# Patient Record
Sex: Male | Born: 1965 | Race: White | Hispanic: No | Marital: Married | Smoking: Never smoker
Health system: Southern US, Community
[De-identification: ages and names within clinical notes are randomized; demographics above are authoritative.]

## PROBLEM LIST (undated history)

## (undated) DIAGNOSIS — I10 Essential (primary) hypertension: Secondary | ICD-10-CM

## (undated) HISTORY — PX: HERNIA REPAIR: SHX51

---

## 2020-09-24 ENCOUNTER — Ambulatory Visit
Admission: EM | Admit: 2020-09-24 | Discharge: 2020-09-24 | Disposition: A | Discharge status: Home / Self Care | Payer: BC Managed Care – PPO | Attending: Family Medicine | Admitting: Family Medicine

## 2020-09-24 ENCOUNTER — Ambulatory Visit (INDEPENDENT_AMBULATORY_CARE_PROVIDER_SITE_OTHER): Payer: BC Managed Care – PPO

## 2020-09-24 DIAGNOSIS — R053 Chronic cough: Secondary | ICD-10-CM

## 2020-09-24 DIAGNOSIS — R062 Wheezing: Secondary | ICD-10-CM

## 2020-09-24 DIAGNOSIS — R059 Cough, unspecified: Secondary | ICD-10-CM | POA: Diagnosis not present

## 2020-09-24 DIAGNOSIS — J069 Acute upper respiratory infection, unspecified: Secondary | ICD-10-CM

## 2020-09-24 HISTORY — DX: Essential (primary) hypertension: I10

## 2020-09-24 MED ORDER — PREDNISONE 20 MG PO TABS: 40.0000 mg | ORAL_TABLET | Freq: Every day | ORAL | 0 refills | Status: DC

## 2020-09-24 MED ORDER — HYDROCOD POLST-CPM POLST ER 10-8 MG/5ML PO SUER: 5.0000 mL | Freq: Every evening | ORAL | 0 refills | Status: DC | PRN

## 2020-09-24 NOTE — Discharge Instructions (Signed)
Be aware, your cough medication may cause drowsiness. Please do not drive, operate heavy machinery or make important decisions while on this medication, it can cloud your judgement.  

## 2020-09-24 NOTE — ED Provider Notes (Signed)
[facility]   [id number] 09/24/20 Arrival Time: 9371  ASSESSMENT & PLAN:  1. Persistent cough for 3 weeks or longer   2. Viral URI with cough   3. Wheezing    I have personally viewed the imaging studies ordered this visit. CXR: No acute changes.  Discussed typical duration of viral illnesses. Declines COVID testing.  Begin trial of: Meds ordered this encounter  Medications   predniSONE (DELTASONE) 20 MG tablet    Sig: Take 2 tablets (40 mg total) by mouth daily.    Dispense:  10 tablet    Refill:  0   chlorpheniramine-HYDROcodone (TUSSIONEX PENNKINETIC ER) 10-8 MG/5ML SUER    Sig: Take 5 mLs by mouth at bedtime as needed for cough.    Dispense:  60 mL    Refill:  0     Discharge Instructions      Be aware, your cough medication may cause drowsiness. Please do not drive, operate heavy machinery or make important decisions while on this medication, it can cloud your judgement.       Follow-up Information     [facility].   Specialty: Urgent Care Why: If worsening or failing to improve as anticipated. Contact information: [address], [address] [place] [address] [phone]                Reviewed expectations re: course of current medical issues. Questions answered. Outlined signs and symptoms indicating need for more acute intervention. Understanding verbalized. After Visit Summary given.   SUBJECTIVE: History from: patient. Kyle Werner is a 55 y.o. male who reports cough and ST; past week. But also with sporadic cough over past month. No SOB or CP. Denies: fever and headache. Normal PO intake without n/v/d. Questions h/o asthma and wheezing at times. Cough does affect sleep.  OBJECTIVE:  Vitals:   09/24/20 1017  BP: (!) 144/89  Pulse: (!) 115  Resp: 20  Temp: 98.5 F (36.9 C)  SpO2: 94%    Tachycardia noted. No symptoms. General appearance: alert; no distress Eyes: PERRLA;  EOMI; conjunctiva normal HENT: [place]; AT; with mild nasal congestion Neck: supple  Lungs: speaks full sentences without difficulty; unlabored; bilateral exp wheezing present Extremities: no edema Skin: warm and dry Neurologic: normal gait Psychological: alert and cooperative; normal mood and affect  Imaging: DG Chest 2 View  Result Date: 09/24/2020 CLINICAL DATA:  Cough for greater than 3 weeks. EXAM: CHEST - 2 VIEW COMPARISON:  None. FINDINGS: Two views of the chest demonstrate slightly coarse lung markings without focal airspace disease or overt pulmonary edema. No pleural effusions. Heart and mediastinum are within normal limits. Trachea is midline. Negative for a pneumothorax. No acute bone abnormality. IMPRESSION: No acute cardiopulmonary disease. Electronically Signed   By: Richarda Overlie M.D.   On: 09/24/2020 10:49    Not on File  Past Medical History:  Diagnosis Date   Hypertension    Social History   Socioeconomic History   Marital status: Married    Spouse name: Not on file   Number of children: Not on file   Years of education: Not on file   Highest education level: Not on file  Occupational History   Not on file  Tobacco Use   Smoking status: Never   Smokeless tobacco: Never  Substance and Sexual Activity   Alcohol use: Not Currently   Drug use: Never   Sexual activity: Not on file  Other Topics Concern  Not on file  Social History Narrative   Not on file   Social Determinants of Health   Financial Resource Strain: Not on file  Food Insecurity: Not on file  Transportation Needs: Not on file  Physical Activity: Not on file  Stress: Not on file  Social Connections: Not on file  Intimate Partner Violence: Not on file   Family History  Problem Relation Age of Onset   Cancer Mother    Hypertension Father    Past Surgical History:  Procedure Laterality Date   HERNIA REPAIR       Mardella Layman, MD 09/24/20 1214

## 2020-09-24 NOTE — ED Triage Notes (Signed)
Pt presents with cough and sore throat for past week, states that he has had recurrent cough for a few months

## 2022-01-06 ENCOUNTER — Ambulatory Visit: Payer: BC Managed Care – PPO | Admitting: Nurse Practitioner

## 2022-01-07 ENCOUNTER — Encounter: Payer: Self-pay | Admitting: Nurse Practitioner

## 2022-01-07 ENCOUNTER — Ambulatory Visit: Payer: BC Managed Care – PPO | Admitting: Nurse Practitioner

## 2022-01-07 VITALS — BP 156/88 | HR 63 | Temp 98.7°F | Ht 69.0 in | Wt 230.0 lb

## 2022-01-07 DIAGNOSIS — E291 Testicular hypofunction: Secondary | ICD-10-CM | POA: Insufficient documentation

## 2022-01-07 DIAGNOSIS — Z0283 Encounter for blood-alcohol and blood-drug test: Secondary | ICD-10-CM | POA: Diagnosis not present

## 2022-01-07 DIAGNOSIS — I10 Essential (primary) hypertension: Secondary | ICD-10-CM | POA: Diagnosis not present

## 2022-01-07 MED ORDER — LOSARTAN POTASSIUM-HCTZ 100-12.5 MG PO TABS: 1.0000 | ORAL_TABLET | Freq: Every day | ORAL | 1 refills | Status: DC

## 2022-01-07 NOTE — Assessment & Plan Note (Signed)
Completed drug screening results pending.

## 2022-01-07 NOTE — Assessment & Plan Note (Signed)
Completed testosterone labs results pending.

## 2022-01-07 NOTE — Patient Instructions (Signed)
Hypertension, Adult ?Hypertension is another name for high blood pressure. High blood pressure forces your heart to work harder to pump blood. This can cause problems over time. ?There are two numbers in a blood pressure reading. There is a top number (systolic) over a bottom number (diastolic). It is best to have a blood pressure that is below 120/80. ?What are the causes? ?The cause of this condition is not known. Some other conditions can lead to high blood pressure. ?What increases the risk? ?Some lifestyle factors can make you more likely to develop high blood pressure: ?Smoking. ?Not getting enough exercise or physical activity. ?Being overweight. ?Having too much fat, sugar, calories, or salt (sodium) in your diet. ?Drinking too much alcohol. ?Other risk factors include: ?Having any of these conditions: ?Heart disease. ?Diabetes. ?High cholesterol. ?Kidney disease. ?Obstructive sleep apnea. ?Having a family history of high blood pressure and high cholesterol. ?Age. The risk increases with age. ?Stress. ?What are the signs or symptoms? ?High blood pressure may not cause symptoms. Very high blood pressure (hypertensive crisis) may cause: ?Headache. ?Fast or uneven heartbeats (palpitations). ?Shortness of breath. ?Nosebleed. ?Vomiting or feeling like you may vomit (nauseous). ?Changes in how you see. ?Very bad chest pain. ?Feeling dizzy. ?Seizures. ?How is this treated? ?This condition is treated by making healthy lifestyle changes, such as: ?Eating healthy foods. ?Exercising more. ?Drinking less alcohol. ?Your doctor may prescribe medicine if lifestyle changes do not help enough and if: ?Your top number is above 130. ?Your bottom number is above 80. ?Your personal target blood pressure may vary. ?Follow these instructions at home: ?Eating and drinking ? ?If told, follow the DASH eating plan. To follow this plan: ?Fill one half of your plate at each meal with fruits and vegetables. ?Fill one fourth of your plate  at each meal with whole grains. Whole grains include whole-wheat pasta, brown rice, and whole-grain bread. ?Eat or drink low-fat dairy products, such as skim milk or low-fat yogurt. ?Fill one fourth of your plate at each meal with low-fat (lean) proteins. Low-fat proteins include fish, chicken without skin, eggs, beans, and tofu. ?Avoid fatty meat, cured and processed meat, or chicken with skin. ?Avoid pre-made or processed food. ?Limit the amount of salt in your diet to less than 1,500 mg each day. ?Do not drink alcohol if: ?Your doctor tells you not to drink. ?You are pregnant, may be pregnant, or are planning to become pregnant. ?If you drink alcohol: ?Limit how much you have to: ?0-1 drink a day for women. ?0-2 drinks a day for men. ?Know how much alcohol is in your drink. In the [place] one drink equals one 12 oz bottle of beer (355 mL), one 5 oz glass of wine (148 mL), or one 1? oz glass of hard liquor (44 mL). ?Lifestyle ? ?Work with your doctor to stay at a healthy weight or to lose weight. Ask your doctor what the best weight is for you. ?Get at least 30 minutes of exercise that causes your heart to beat faster (aerobic exercise) most days of the week. This may include walking, swimming, or biking. ?Get at least 30 minutes of exercise that strengthens your muscles (resistance exercise) at least 3 days a week. This may include lifting weights or doing Pilates. ?Do not smoke or use any products that contain nicotine or tobacco. If you need help quitting, ask your doctor. ?Check your blood pressure at home as told by your doctor. ?Keep all follow-up visits. ?Medicines ?Take over-the-counter and prescription medicines   only as told by your doctor. Follow directions carefully. ?Do not skip doses of blood pressure medicine. The medicine does not work as well if you skip doses. Skipping doses also puts you at risk for problems. ?Ask your doctor about side effects or reactions to medicines that you should watch  for. ?Contact a doctor if: ?You think you are having a reaction to the medicine you are taking. ?You have headaches that keep coming back. ?You feel dizzy. ?You have swelling in your ankles. ?You have trouble with your vision. ?Get help right away if: ?You get a very bad headache. ?You start to feel mixed up (confused). ?You feel weak or numb. ?You feel faint. ?You have very bad pain in your: ?Chest. ?Belly (abdomen). ?You vomit more than once. ?You have trouble breathing. ?These symptoms may be an emergency. Get help right away. Call 911. ?Do not wait to see if the symptoms will go away. ?Do not drive yourself to the hospital. ?Summary ?Hypertension is another name for high blood pressure. ?High blood pressure forces your heart to work harder to pump blood. ?For most people, a normal blood pressure is less than 120/80. ?Making healthy choices can help lower blood pressure. If your blood pressure does not get lower with healthy choices, you may need to take medicine. ?This information is not intended to replace advice given to you by your health care provider. Make sure you discuss any questions you have with your health care provider. ?Document Revised: 12/05/2020 Document Reviewed: 12/05/2020 ?Elsevier Patient Education ? 2023 Elsevier Inc. ? ?

## 2022-01-07 NOTE — Assessment & Plan Note (Signed)
Completed assessment.  Education provided to patient on hypertension management.  Advised low-sodium heart healthy diet, increase hydration, exercise and weight loss recommended increase vegetables and fruits in diet.  Rx refill sent to pharmacy. Follow-up in 6 months.

## 2022-01-07 NOTE — Progress Notes (Signed)
New Patient Note  RE: Suryansh Craigen MRN: [id number] DOB: [birth year] Date of Office Visit: 01/07/2022  Chief Complaint: Establish Care and Hypertension  History of Present Illness: Patient is a 56 year old male who presents to clinic to establish care with a history of hypertension.  Hypertension: Patient here for follow-up of elevated blood pressure. He is not exercising and is not adherent to low salt diet.  Blood pressure is not well controlled at home, blood pressure medication has not been taking his medication as prescribed causing him to have elevated blood pressure in clinic today. Cardiac symptoms none. Patient denies none.  Cardiovascular risk factors: none. Use of agents associated with hypertension: none. History of target organ damage: none.   Assessment and Plan: Elijah is a 56 y.o. male with: Essential hypertension Completed assessment.  Education provided to patient on hypertension management.  Advised low-sodium heart healthy diet, increase hydration, exercise and weight loss recommended increase vegetables and fruits in diet.  Rx refill sent to pharmacy. Follow-up in 6 months.  Hypogonadism in male Completed testosterone labs results pending.  Encounter for drug screening Completed drug screening results pending.  Return in about 3 months (around 04/09/2022).   Diagnostics:   Past Medical History: Patient Active Problem List   Diagnosis Date Noted   Essential hypertension 01/07/2022   Hypogonadism in male 01/07/2022   Encounter for drug screening 01/07/2022   Past Medical History:  Diagnosis Date   Hypertension    Past Surgical History: Past Surgical History:  Procedure Laterality Date   HERNIA REPAIR     Medication List:  Current Outpatient Medications  Medication Sig Dispense Refill   albuterol (VENTOLIN HFA) 108 (90 Base) MCG/ACT inhaler Inhale into the lungs every 6 (six) hours as needed for wheezing or shortness of breath.     testosterone cypionate  (DEPOTESTOTERONE CYPIONATE) 100 MG/ML injection Inject 200 mg into the muscle every 14 (fourteen) days. For IM use only     losartan-hydrochlorothiazide (HYZAAR) 100-12.5 MG tablet Take 1 tablet by mouth daily. 90 tablet 1   No current facility-administered medications for this visit.   Allergies: No Known Allergies Social History: Social History   Socioeconomic History   Marital status: Married    Spouse name: Pamala   Number of children: 4   Years of education: Not on file   Highest education level: Not on file  Occupational History   Occupation: Development worker, [employer]  Tobacco Use   Smoking status: Never   Smokeless tobacco: Never  Vaping Use   Vaping Use: Never used  Substance and Sexual Activity   Alcohol use: Not Currently   Drug use: Never   Sexual activity: Yes  Other Topics Concern   Not on file  Social History Narrative   Not on file   Social Determinants of Health   Financial Resource Strain: Not on file  Food Insecurity: Not on file  Transportation Needs: Not on file  Physical Activity: Not on file  Stress: Not on file  Social Connections: Not on file       Family History: Family History  Problem Relation Age of Onset   Cancer Mother    Hypertension Father          Review of Systems  Constitutional: Negative.  Negative for activity change and appetite change.  HENT: Negative.    Eyes: Negative.   Respiratory: Negative.    Cardiovascular: Negative.   Genitourinary: Negative.   Musculoskeletal: Negative.   Skin: Negative.   All  other systems reviewed and are negative.  Objective: BP (!) 156/88   Pulse 63   Temp 98.7 F (37.1 C)   Ht 5\' 9"  (1.753 m)   Wt 230 lb (104.3 kg)   SpO2 95%   BMI 33.97 kg/m  Body mass index is 33.97 kg/m. Physical Exam Vitals and nursing note reviewed.  HENT:     Head: Normocephalic.     Right Ear: External ear normal.     Left Ear: External ear normal.     Nose: Nose normal.     Mouth/Throat:      Mouth: Mucous membranes are moist.     Pharynx: Oropharynx is clear.  Eyes:     Conjunctiva/sclera: Conjunctivae normal.  Cardiovascular:     Pulses: Normal pulses.     Heart sounds: Normal heart sounds.  Pulmonary:     Effort: Pulmonary effort is normal.     Breath sounds: Normal breath sounds.  Abdominal:     General: Bowel sounds are normal.  Skin:    General: Skin is warm.     Findings: No erythema or rash.  Neurological:     General: No focal deficit present.     Mental Status: He is alert and oriented to person, place, and time.  Psychiatric:        Mood and Affect: Mood normal.        Behavior: Behavior normal.    The plan was reviewed with the patient/family, and all questions/concerned were addressed.  It was my pleasure to see Kiah today and participate in his care. Please feel free to contact me with any questions or concerns.  Sincerely,  Fayrene Fearing NP [facility]

## 2022-01-08 ENCOUNTER — Other Ambulatory Visit: Payer: Self-pay | Admitting: Nurse Practitioner

## 2022-01-08 DIAGNOSIS — E291 Testicular hypofunction: Secondary | ICD-10-CM

## 2022-01-08 MED ORDER — TESTOSTERONE CYPIONATE 100 MG/ML IM SOLN: 200.0000 mg | INTRAMUSCULAR | 0 refills | Status: DC

## 2022-01-09 LAB — CMP14+EGFR
ALT: 55 IU/L — ABNORMAL HIGH (ref 0–44)
AST: 29 IU/L (ref 0–40)
Albumin/Globulin Ratio: 1.7 (ref 1.2–2.2)
Albumin: 4.5 g/dL (ref 3.8–4.9)
Alkaline Phosphatase: 110 IU/L (ref 44–121)
BUN/Creatinine Ratio: 15 (ref 9–20)
BUN: 16 mg/dL (ref 6–24)
Bilirubin Total: 0.5 mg/dL (ref 0.0–1.2)
CO2: 24 mmol/L (ref 20–29)
Calcium: 9.4 mg/dL (ref 8.7–10.2)
Chloride: 102 mmol/L (ref 96–106)
Creatinine, Ser: 1.04 mg/dL (ref 0.76–1.27)
Globulin, Total: 2.7 g/dL (ref 1.5–4.5)
Glucose: 81 mg/dL (ref 70–99)
Potassium: 4.3 mmol/L (ref 3.5–5.2)
Sodium: 140 mmol/L (ref 134–144)
Total Protein: 7.2 g/dL (ref 6.0–8.5)
eGFR: 84 mL/min/{1.73_m2} (ref >59)

## 2022-01-09 LAB — CBC WITH DIFFERENTIAL/PLATELET
Basophils Absolute: 0.1 10*3/uL (ref 0.0–0.2)
Basos: 1 %
EOS (ABSOLUTE): 0.2 10*3/uL (ref 0.0–0.4)
Eos: 2 %
Hematocrit: 48.6 % (ref 37.5–51.0)
Hemoglobin: 16.7 g/dL (ref 13.0–17.7)
Immature Grans (Abs): 0.1 10*3/uL (ref 0.0–0.1)
Immature Granulocytes: 1 %
Lymphocytes Absolute: 2.2 10*3/uL (ref 0.7–3.1)
Lymphs: 26 %
MCH: 31.2 pg (ref 26.6–33.0)
MCHC: 34.4 g/dL (ref 31.5–35.7)
MCV: 91 fL (ref 79–97)
Monocytes Absolute: 0.7 10*3/uL (ref 0.1–0.9)
Monocytes: 8 %
Neutrophils Absolute: 5.3 10*3/uL (ref 1.4–7.0)
Neutrophils: 62 %
Platelets: 323 10*3/uL (ref 150–450)
RBC: 5.36 x10E6/uL (ref 4.14–5.80)
RDW: 11.6 % (ref 11.6–15.4)
WBC: 8.6 10*3/uL (ref 3.4–10.8)

## 2022-01-09 LAB — LIPID PANEL
Chol/HDL Ratio: 5.2 ratio — ABNORMAL HIGH (ref 0.0–5.0)
Cholesterol, Total: 225 mg/dL — ABNORMAL HIGH (ref 100–199)
HDL: 43 mg/dL (ref >39)
LDL Chol Calc (NIH): 151 mg/dL — ABNORMAL HIGH (ref 0–99)
Triglycerides: 171 mg/dL — ABNORMAL HIGH (ref 0–149)
VLDL Cholesterol Cal: 31 mg/dL (ref 5–40)

## 2022-01-09 LAB — TESTOSTERONE,FREE AND TOTAL
Testosterone, Free: 4 pg/mL — ABNORMAL LOW (ref 7.2–24.0)
Testosterone: 124 ng/dL — ABNORMAL LOW (ref 264–916)

## 2022-01-12 LAB — TOXASSURE SELECT 13 (MW), URINE

## 2022-01-14 ENCOUNTER — Telehealth: Payer: Self-pay | Admitting: Nurse Practitioner

## 2022-01-14 ENCOUNTER — Other Ambulatory Visit: Payer: Self-pay

## 2022-01-14 DIAGNOSIS — E291 Testicular hypofunction: Secondary | ICD-10-CM

## 2022-01-14 MED ORDER — TESTOSTERONE CYPIONATE 100 MG/ML IM SOLN: 200.0000 mg | INTRAMUSCULAR | 0 refills | Status: DC

## 2022-01-14 NOTE — Telephone Encounter (Signed)
Yes its okay to resend

## 2022-01-20 ENCOUNTER — Other Ambulatory Visit: Payer: Self-pay | Admitting: Nurse Practitioner

## 2022-01-20 DIAGNOSIS — E291 Testicular hypofunction: Secondary | ICD-10-CM

## 2022-01-20 MED ORDER — TESTOSTERONE CYPIONATE 100 MG/ML IM SOLN: 200.0000 mg | INTRAMUSCULAR | 0 refills | Status: DC

## 2022-01-20 NOTE — Telephone Encounter (Signed)
Please resend refill, 01/14/22 refill was set to Print did not go electronically

## 2022-01-20 NOTE — Telephone Encounter (Signed)
  Prescription Request  01/20/2022  Is this a "Controlled Substance" medicine?   Have you seen your PCP in the last 2 weeks? Seen 11/8  If YES, route message to pool  -  If NO, patient needs to be scheduled for appointment.  What is the name of the medication or equipment?  testosterone cypionate (DEPOTESTOTERONE CYPIONATE) 100 MG/ML injection    Have you contacted your pharmacy to request a refill? Yes, they said they did not receive it    Which pharmacy would you like this sent to? [facility] in [place]   Patient notified that their request is being sent to the clinical staff for review and that they should receive a response within 2 business days.

## 2022-03-22 ENCOUNTER — Other Ambulatory Visit: Payer: Self-pay | Admitting: Nurse Practitioner

## 2022-03-22 DIAGNOSIS — E291 Testicular hypofunction: Secondary | ICD-10-CM

## 2022-03-23 NOTE — Telephone Encounter (Signed)
Je pt NTBS in Feb by new provider. RF for controlled med not sent to covering provider, since 3 mos FU not already scheduled.

## 2022-03-23 NOTE — Telephone Encounter (Signed)
Pt scheduled appt with TM 04/06/22

## 2022-04-06 ENCOUNTER — Encounter: Payer: Self-pay | Admitting: Family Medicine

## 2022-04-06 ENCOUNTER — Ambulatory Visit (INDEPENDENT_AMBULATORY_CARE_PROVIDER_SITE_OTHER): Payer: BC Managed Care – PPO | Admitting: Family Medicine

## 2022-04-06 VITALS — BP 139/82 | HR 81 | Temp 98.6°F | Ht 69.0 in | Wt 247.2 lb

## 2022-04-06 DIAGNOSIS — E782 Mixed hyperlipidemia: Secondary | ICD-10-CM | POA: Diagnosis not present

## 2022-04-06 DIAGNOSIS — I10 Essential (primary) hypertension: Secondary | ICD-10-CM | POA: Diagnosis not present

## 2022-04-06 DIAGNOSIS — E291 Testicular hypofunction: Secondary | ICD-10-CM | POA: Diagnosis not present

## 2022-04-06 DIAGNOSIS — L989 Disorder of the skin and subcutaneous tissue, unspecified: Secondary | ICD-10-CM

## 2022-04-06 DIAGNOSIS — R110 Nausea: Secondary | ICD-10-CM

## 2022-04-06 DIAGNOSIS — K529 Noninfective gastroenteritis and colitis, unspecified: Secondary | ICD-10-CM

## 2022-04-06 MED ORDER — TESTOSTERONE CYPIONATE 100 MG/ML IM SOLN: 200.0000 mg | INTRAMUSCULAR | 0 refills | Status: DC

## 2022-04-06 NOTE — Progress Notes (Signed)
Established Patient Office Visit  Subjective   Patient ID: Kyle Werner, male    DOB: [birth year]  Age: 57 y.o. MRN: [id number]  Chief Complaint  Patient presents with   skin lesion   Morning Sickness   Hyperlipidemia   Medical Management of Chronic Issues    HPI HTN Complaint with meds - Yes Current Medications - losartan-hctz  Checking BP at home ranging 130s/80s Exercising Regularly - yard work Company secretary intake - No Pertinent ROS:  Headache - No Fatigue - No Visual Disturbances - No Chest pain - No Dyspnea - No Palpitations - No LE edema - No  2. HLD Regular diet.   3. Low testosterone Started supplement 3 months ago.   4. Hand lesion On left hand. Itchy. Won't heal.   5. Nausea/diarrhea Reports nausea throughout the day for years. Reports diarrhea after eating. This has only been going on years as well. Denies heartburn, dysphagia, blood in stool, constipation, abdominal pain, weight loss, or vomiting. He has never had this evaluated and has never had colon cancer screening.   Past Medical History:  Diagnosis Date   Hypertension     ROS As per HPI.    Objective:     BP 139/82   Pulse 81   Temp 98.6 F (37 C) (Temporal)   Ht 5\' 9"  (1.753 m)   Wt 247 lb 4 oz (112.2 kg)   SpO2 94%   BMI 36.51 kg/m  BP Readings from Last 3 Encounters:  04/06/22 139/82  01/07/22 (!) 156/88  09/24/20 (!) 144/89      Physical Exam Vitals and nursing note reviewed.  Constitutional:      General: He is not in acute distress.    Appearance: He is obese. He is not ill-appearing, toxic-appearing or diaphoretic.  Neck:     Thyroid: No thyroid mass, thyromegaly or thyroid tenderness.     Vascular: No carotid bruit.  Cardiovascular:     Rate and Rhythm: Normal rate and regular rhythm.     Heart sounds: Normal heart sounds. No murmur heard. Abdominal:     General: Bowel sounds are normal. There is no distension.     Palpations: Abdomen is soft.     Tenderness:  There is no abdominal tenderness. There is no guarding or rebound. Negative signs include Murphy's sign and McBurney's sign.  Musculoskeletal:     Cervical back: Neck supple. No rigidity.     Right lower leg: No edema.     Left lower leg: No edema.  Lymphadenopathy:     Cervical: No cervical adenopathy.  Skin:    General: Skin is warm and dry.     Comments: Raised pigmented lesion on dorsal aspect of left hand with crusted center. No exudate, erythema, or tenderness.   Neurological:     General: No focal deficit present.     Mental Status: He is alert and oriented to person, place, and time.  Psychiatric:        Mood and Affect: Mood normal.        Behavior: Behavior normal.      No results found for any visits on 04/06/22.    The 10-year ASCVD risk score (Arnett DK, et al., 2019) is: 12.3%    Assessment & Plan:   Kyle Werner was seen today for skin lesion, morning sickness, hyperlipidemia and medical management of chronic issues.  Diagnoses and all orders for this visit:  Primary hypertension Well controlled on current regimen.  -  CBC with Differential/Platelet; Future -     CMP14+EGFR; Future -     TSH; Future  Mixed hyperlipidemia LDL was 151. Diet and exercise. Declined statin.   Morbid obesity (HCC) BMI 36 with HLD and HTN. Labs pending.  -     CBC with Differential/Platelet; Future -     CMP14+EGFR; Future -     TSH; Future  Hypogonadism in male CSA signed today. UDS is UTD. PDMP reviewed and no red flags. Refills provided. He will return to recheck levels since starting supplement.  -     Testosterone,Free and Total; Future -     testosterone cypionate (DEPOTESTOTERONE CYPIONATE) 100 MG/ML injection; Inject 2 mLs (200 mg total) into the muscle every 14 (fourteen) days. For IM use only  Chronic diarrhea Chronic nausea Benign exam. Labs pending. Referral to GI.  -     CBC with Differential/Platelet; Future -     CMP14+EGFR; Future -     Lipase; Future -      Ambulatory referral to Gastroenterology  Skin lesion Referral to derm for further evaluation.  -     Ambulatory referral to Dermatology   Return in about 6 months (around 10/05/2022) for chronic follow up.   The patient indicates understanding of these issues and agrees with the plan.  Gwenlyn Perking, FNP

## 2022-04-07 ENCOUNTER — Encounter: Payer: Self-pay | Admitting: Internal Medicine

## 2022-04-22 ENCOUNTER — Telehealth: Payer: Self-pay

## 2022-04-22 NOTE — Telephone Encounter (Signed)
[address] (Key: [place]) PA Case ID #: [id number] Rx #: [id number] Need Help? Call us at [phone] Status sent iconSent to Plan today Drug Depo-Testosterone 100MG/ML solution ePA cloud logo Form [employer] Electronic PA Form [phone] NCPDP)

## 2022-04-23 ENCOUNTER — Other Ambulatory Visit (HOSPITAL_COMMUNITY): Payer: Self-pay

## 2022-04-23 NOTE — Telephone Encounter (Signed)
Patient Advocate Encounter  Prior Authorization for Larina Bras has been approved.     Effective dates: Approved through 04/22/2025  Placed a call to the pharmacy to notify of the approval.

## 2022-04-27 ENCOUNTER — Other Ambulatory Visit: Payer: BC Managed Care – PPO

## 2022-04-27 DIAGNOSIS — K529 Noninfective gastroenteritis and colitis, unspecified: Secondary | ICD-10-CM

## 2022-04-27 DIAGNOSIS — R110 Nausea: Secondary | ICD-10-CM

## 2022-04-27 DIAGNOSIS — I10 Essential (primary) hypertension: Secondary | ICD-10-CM

## 2022-04-27 DIAGNOSIS — E291 Testicular hypofunction: Secondary | ICD-10-CM

## 2022-04-29 ENCOUNTER — Encounter (INDEPENDENT_AMBULATORY_CARE_PROVIDER_SITE_OTHER): Payer: Self-pay | Admitting: *Deleted

## 2022-04-29 ENCOUNTER — Other Ambulatory Visit (INDEPENDENT_AMBULATORY_CARE_PROVIDER_SITE_OTHER): Payer: Self-pay | Admitting: Internal Medicine

## 2022-04-29 ENCOUNTER — Encounter: Payer: Self-pay | Admitting: Internal Medicine

## 2022-04-29 ENCOUNTER — Ambulatory Visit (INDEPENDENT_AMBULATORY_CARE_PROVIDER_SITE_OTHER): Payer: BC Managed Care – PPO | Admitting: Internal Medicine

## 2022-04-29 VITALS — BP 144/87 | HR 81 | Temp 98.4°F | Ht 69.0 in | Wt 241.4 lb

## 2022-04-29 DIAGNOSIS — R1033 Periumbilical pain: Secondary | ICD-10-CM

## 2022-04-29 DIAGNOSIS — R197 Diarrhea, unspecified: Secondary | ICD-10-CM

## 2022-04-29 DIAGNOSIS — R110 Nausea: Secondary | ICD-10-CM

## 2022-04-29 DIAGNOSIS — K625 Hemorrhage of anus and rectum: Secondary | ICD-10-CM | POA: Diagnosis not present

## 2022-04-29 MED ORDER — NA SULFATE-K SULFATE-MG SULF 17.5-3.13-1.6 GM/177ML PO SOLN: ORAL | 0 refills | Status: DC

## 2022-04-29 NOTE — Patient Instructions (Signed)
We will schedule you for upper endoscopy to further evaluate your abdominal discomfort and chronic nausea.  At the same time we will perform colonoscopy for colon cancer screening purposes, as well as to evaluate your chronic loose stools and occasional rectal bleeding.  We may need to evaluate your gallbladder if endoscopic evaluation is unremarkable.  For your diarrhea, you can take Imodium as needed to see if this helps.  Further recommendations to follow.  It was very nice meeting you today.  Thank you for your service to our community.  Dr. Abbey Chatters

## 2022-04-29 NOTE — Progress Notes (Signed)
Primary Care Physician:  Gwenlyn Perking, FNP Primary Gastroenterologist:  Dr. Abbey Chatters  Chief Complaint  Patient presents with   Diarrhea    Referred for diarrhea and nausea. Reports everything he eats makes him sick.     HPI:   Kyle Werner is a 57 y.o. male who presents to the clinic today by referral from PCP Marjorie Smolder for evaluation.  Has multiple GI complaints for me today.  For over 2 years has had issues with chronically loose stools.  Averages 3-5 loose bowel movements daily.  Will very rarely have a normal bowel movement.  Also notes occasional rectal bleeding.  Notes bright red blood both on tissue paper and in the toilet bowl at times.  No family history of colon cancer.  No previous colonoscopy.  Also with nausea primarily after meals.  States he is cut back on eating as much but continues to have nausea.  No dysphagia odynophagia.  No chronic GERD.  No chest pain.  Does note abdominal pain in the periumbilical region as well.  Sometimes epigastric as well.  Mild, dull, aching in nature.  Past Medical History:  Diagnosis Date   Hypertension     Past Surgical History:  Procedure Laterality Date   HERNIA REPAIR      Current Outpatient Medications  Medication Sig Dispense Refill   albuterol (VENTOLIN HFA) 108 (90 Base) MCG/ACT inhaler Inhale into the lungs every 6 (six) hours as needed for wheezing or shortness of breath.     losartan-hydrochlorothiazide (HYZAAR) 100-12.5 MG tablet Take 1 tablet by mouth daily. 90 tablet 1   testosterone cypionate (DEPOTESTOTERONE CYPIONATE) 100 MG/ML injection Inject 2 mLs (200 mg total) into the muscle every 14 (fourteen) days. For IM use only 10 mL 0   No current facility-administered medications for this visit.    Allergies as of 04/29/2022   (No Known Allergies)    Family History  Problem Relation Age of Onset   Cancer Mother    Hypertension Father     Social History   Socioeconomic History   Marital status:  Married    Spouse name: Pamala   Number of children: 4   Years of education: Not on file   Highest education level: Not on file  Occupational History   Occupation: Development worker, [employer]  Tobacco Use   Smoking status: Never    Passive exposure: Never   Smokeless tobacco: Never  Vaping Use   Vaping Use: Never used  Substance and Sexual Activity   Alcohol use: Not Currently   Drug use: Never   Sexual activity: Yes  Other Topics Concern   Not on file  Social History Narrative   Not on file   Social Determinants of Health   Financial Resource Strain: Not on file  Food Insecurity: Not on file  Transportation Needs: Not on file  Physical Activity: Not on file  Stress: Not on file  Social Connections: Not on file  Intimate Partner Violence: Not on file    Subjective: Review of Systems  Constitutional:  Negative for chills and fever.  HENT:  Negative for congestion and hearing loss.   Eyes:  Negative for blurred vision and double vision.  Respiratory:  Negative for cough and shortness of breath.   Cardiovascular:  Negative for chest pain and palpitations.  Gastrointestinal:  Positive for abdominal pain, diarrhea and nausea. Negative for blood in stool, constipation, heartburn, melena and vomiting.  Genitourinary:  Negative for dysuria and urgency.  Musculoskeletal:  Negative for joint pain and myalgias.  Skin:  Negative for itching and rash.  Neurological:  Negative for dizziness and headaches.  Psychiatric/Behavioral:  Negative for depression. The patient is not nervous/anxious.        Objective: There were no vitals taken for this visit. Physical Exam Constitutional:      Appearance: Normal appearance.  HENT:     Head: Normocephalic and atraumatic.  Eyes:     Extraocular Movements: Extraocular movements intact.     Conjunctiva/sclera: Conjunctivae normal.  Cardiovascular:     Rate and Rhythm: Normal rate and regular rhythm.  Pulmonary:     Effort: Pulmonary  effort is normal.     Breath sounds: Normal breath sounds.  Abdominal:     General: Bowel sounds are normal.     Palpations: Abdomen is soft.  Musculoskeletal:        General: Normal range of motion.     Cervical back: Normal range of motion and neck supple.  Skin:    General: Skin is warm.  Neurological:     General: No focal deficit present.     Mental Status: He is alert and oriented to person, place, and time.  Psychiatric:        Mood and Affect: Mood normal.        Behavior: Behavior normal.      Assessment: *Chronic nausea *Chronic diarrhea  *Rectal bleeding  *Abdominal pain  Plan: Will schedule for EGD to evaluate for peptic ulcer disease, esophagitis, gastritis, H. Pylori, duodenitis, or other. Will also evaluate for esophageal stricture, Schatzki's ring, esophageal web or other.   At the same time, will perform colonoscopy to evaluate rectal bleeding and chronic diarrhea.   The risks including infection, bleed, or perforation as well as benefits, limitations, alternatives and imponderables have been reviewed with the patient. Potential for esophageal dilation, biopsy, etc. have also been reviewed.  Questions have been answered. All parties agreeable.  Imodium as needed for diarrhea.   Consider [place] to evaluate for biliary colic if endoscopic examinations unremarkable.   TSH WNL.   Thank you Marjorie Smolder for the kind referral.   04/29/2022 3:03 PM   Disclaimer: This note was dictated with voice recognition software. Similar sounding words can inadvertently be transcribed and may not be corrected upon review.

## 2022-05-02 LAB — CMP14+EGFR
ALT: 53 IU/L — ABNORMAL HIGH (ref 0–44)
AST: 31 IU/L (ref 0–40)
Albumin/Globulin Ratio: 1.8 (ref 1.2–2.2)
Albumin: 4.3 g/dL (ref 3.8–4.9)
Alkaline Phosphatase: 104 IU/L (ref 44–121)
BUN/Creatinine Ratio: 12 (ref 9–20)
BUN: 13 mg/dL (ref 6–24)
Bilirubin Total: 0.5 mg/dL (ref 0.0–1.2)
CO2: 22 mmol/L (ref 20–29)
Calcium: 9.4 mg/dL (ref 8.7–10.2)
Chloride: 102 mmol/L (ref 96–106)
Creatinine, Ser: 1.1 mg/dL (ref 0.76–1.27)
Globulin, Total: 2.4 g/dL (ref 1.5–4.5)
Glucose: 95 mg/dL (ref 70–99)
Potassium: 4.3 mmol/L (ref 3.5–5.2)
Sodium: 141 mmol/L (ref 134–144)
Total Protein: 6.7 g/dL (ref 6.0–8.5)
eGFR: 78 mL/min/{1.73_m2} (ref >59)

## 2022-05-02 LAB — LIPASE: Lipase: 21 U/L (ref 13–78)

## 2022-05-02 LAB — CBC WITH DIFFERENTIAL/PLATELET
Basophils Absolute: 0 10*3/uL (ref 0.0–0.2)
Basos: 0 %
EOS (ABSOLUTE): 0.1 10*3/uL (ref 0.0–0.4)
Eos: 2 %
Hematocrit: 53.2 % — ABNORMAL HIGH (ref 37.5–51.0)
Hemoglobin: 17.6 g/dL (ref 13.0–17.7)
Immature Grans (Abs): 0.1 10*3/uL (ref 0.0–0.1)
Immature Granulocytes: 1 %
Lymphocytes Absolute: 1.7 10*3/uL (ref 0.7–3.1)
Lymphs: 22 %
MCH: 30.4 pg (ref 26.6–33.0)
MCHC: 33.1 g/dL (ref 31.5–35.7)
MCV: 92 fL (ref 79–97)
Monocytes Absolute: 0.6 10*3/uL (ref 0.1–0.9)
Monocytes: 7 %
Neutrophils Absolute: 5.4 10*3/uL (ref 1.4–7.0)
Neutrophils: 68 %
Platelets: 353 10*3/uL (ref 150–450)
RBC: 5.79 x10E6/uL (ref 4.14–5.80)
RDW: 12.1 % (ref 11.6–15.4)
WBC: 7.9 10*3/uL (ref 3.4–10.8)

## 2022-05-02 LAB — VITAMIN B12: Vitamin B-12: 644 pg/mL (ref 232–1245)

## 2022-05-02 LAB — TESTOSTERONE,FREE AND TOTAL
Testosterone, Free: 10.1 pg/mL (ref 7.2–24.0)
Testosterone: 462 ng/dL (ref 264–916)

## 2022-05-02 LAB — TSH: TSH: 2.22 u[IU]/mL (ref 0.450–4.500)

## 2022-05-04 ENCOUNTER — Telehealth (INDEPENDENT_AMBULATORY_CARE_PROVIDER_SITE_OTHER): Payer: Self-pay | Admitting: Internal Medicine

## 2022-05-04 DIAGNOSIS — Z1211 Encounter for screening for malignant neoplasm of colon: Secondary | ICD-10-CM

## 2022-05-04 DIAGNOSIS — R110 Nausea: Secondary | ICD-10-CM

## 2022-05-04 DIAGNOSIS — R1033 Periumbilical pain: Secondary | ICD-10-CM

## 2022-05-04 NOTE — Telephone Encounter (Signed)
Pt wife called back and states that they would need to do any Wednesday in April. Advised wife that we would call back once we received April schedule.

## 2022-05-04 NOTE — Telephone Encounter (Signed)
Pt left voicemail and is wanting to reschedule TCS with Dr.Carver on 05/26/22. Called pt back and informed him that Dr.Carver had no availability in March and that we could call with April schedule. Pt would like call back when April schedule comes out. Thank you!

## 2022-05-04 NOTE — Telephone Encounter (Signed)
Printed and placed in providers folder to schedule in April

## 2022-05-11 NOTE — Addendum Note (Signed)
Addended by: Cheron Every on: 05/11/2022 10:59 AM   Modules accepted: Orders

## 2022-05-11 NOTE — Telephone Encounter (Signed)
CALLED PT. Aware Dr. Abbey Chatters is in office on wed. Procedure for TCS/egd ASA 2 SCHEDULED FOR 4/23 at 930am. Aware [facility]. Instructions will be mailed. He will need lab work prior due to being on HCTZ

## 2022-05-13 ENCOUNTER — Telehealth (INDEPENDENT_AMBULATORY_CARE_PROVIDER_SITE_OTHER): Payer: Self-pay | Admitting: Internal Medicine

## 2022-05-13 NOTE — Telephone Encounter (Signed)
Pt wife Olin Hauser called in and was wanting to schedule TCS with Dr.Carver. Pt currently on scheduled with Dr.Carver for 06/23/22 9:30 AM ASA 2. Wife states she is needing a Wednesday due to being off on Wednesdays. Informed wife that provider is in office on Wednesday. Wife states she will call her work to see if they can accommodate and will call us back.

## 2022-05-26 ENCOUNTER — Ambulatory Visit (HOSPITAL_COMMUNITY): Admit: 2022-05-26 | Payer: BC Managed Care – PPO

## 2022-05-26 ENCOUNTER — Encounter (HOSPITAL_COMMUNITY): Payer: Self-pay

## 2022-05-26 SURGERY — COLONOSCOPY WITH PROPOFOL
Anesthesia: Monitor Anesthesia Care

## 2022-06-19 ENCOUNTER — Telehealth: Payer: Self-pay | Admitting: *Deleted

## 2022-06-19 NOTE — Telephone Encounter (Signed)
Pt called to cancel his procedure for 06/23/22. He says he is having family issues at this time. He will call back to reschedule. FYI

## 2022-06-23 ENCOUNTER — Ambulatory Visit (HOSPITAL_COMMUNITY): Admission: RE | Admit: 2022-06-23 | Payer: BC Managed Care – PPO | Source: Home / Self Care

## 2022-06-23 ENCOUNTER — Encounter (HOSPITAL_COMMUNITY): Admission: RE | Payer: Self-pay | Source: Home / Self Care

## 2022-06-23 SURGERY — COLONOSCOPY WITH PROPOFOL
Anesthesia: Monitor Anesthesia Care

## 2022-07-10 ENCOUNTER — Other Ambulatory Visit: Payer: Self-pay | Admitting: *Deleted

## 2022-07-10 DIAGNOSIS — I10 Essential (primary) hypertension: Secondary | ICD-10-CM

## 2022-07-10 MED ORDER — LOSARTAN POTASSIUM-HCTZ 100-12.5 MG PO TABS: 1.0000 | ORAL_TABLET | Freq: Every day | ORAL | 0 refills | Status: DC

## 2022-10-05 ENCOUNTER — Ambulatory Visit: Payer: BC Managed Care – PPO | Admitting: Family Medicine

## 2022-10-08 ENCOUNTER — Other Ambulatory Visit: Payer: Self-pay | Admitting: Family Medicine

## 2022-10-08 DIAGNOSIS — I10 Essential (primary) hypertension: Secondary | ICD-10-CM

## 2022-10-22 ENCOUNTER — Other Ambulatory Visit: Payer: Self-pay | Admitting: Family Medicine

## 2022-10-22 DIAGNOSIS — I10 Essential (primary) hypertension: Secondary | ICD-10-CM

## 2022-11-13 ENCOUNTER — Other Ambulatory Visit: Payer: Self-pay | Admitting: Orthopedic Surgery

## 2022-11-13 DIAGNOSIS — M25562 Pain in left knee: Secondary | ICD-10-CM

## 2022-11-20 IMAGING — DX DG CHEST 2V
2 series · 2 of 2 positions shown · non-contrast
Comparison: None.

CLINICAL DATA: Cough for greater than 3 weeks.

EXAM:
CHEST - 2 VIEW

[chest pa]
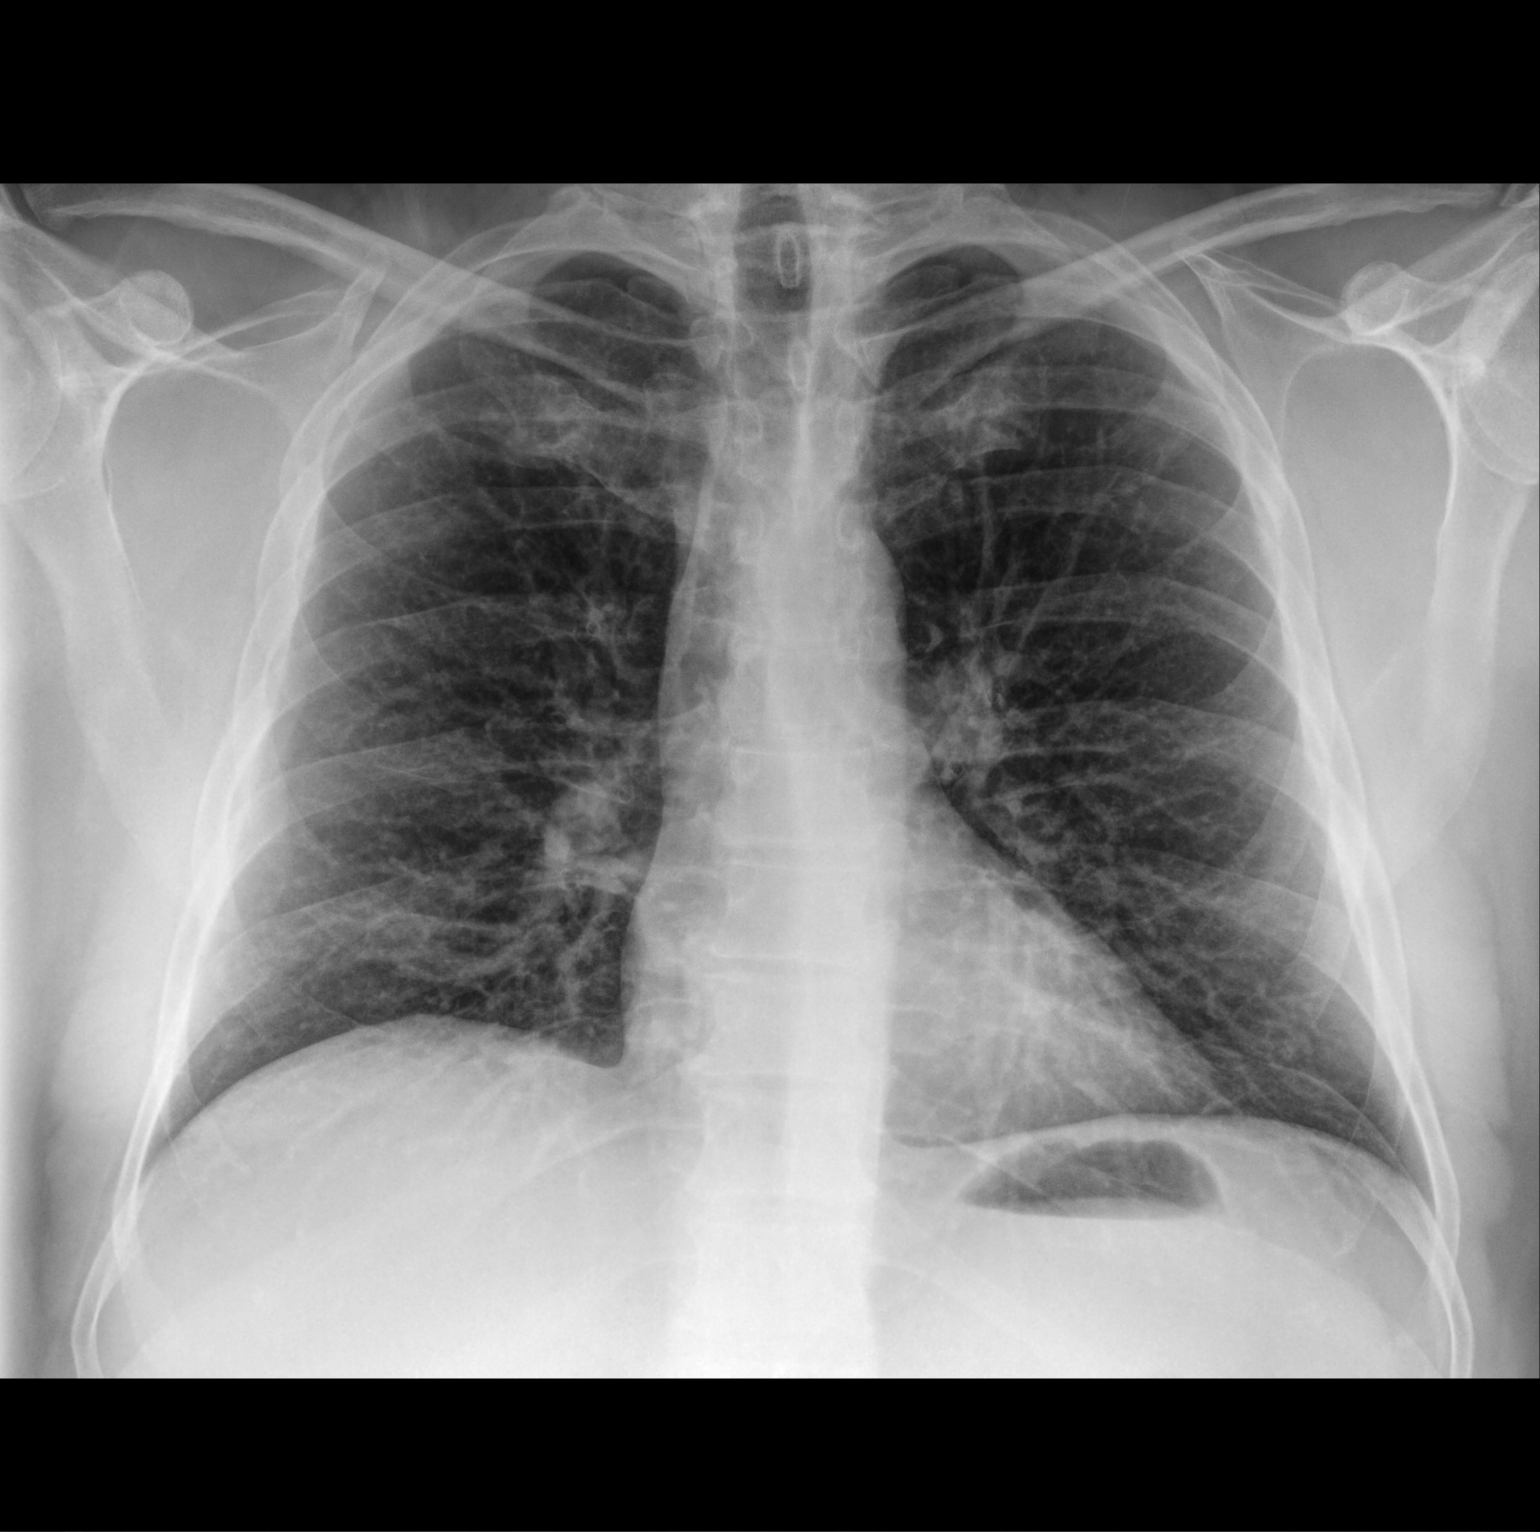

[chest lat]
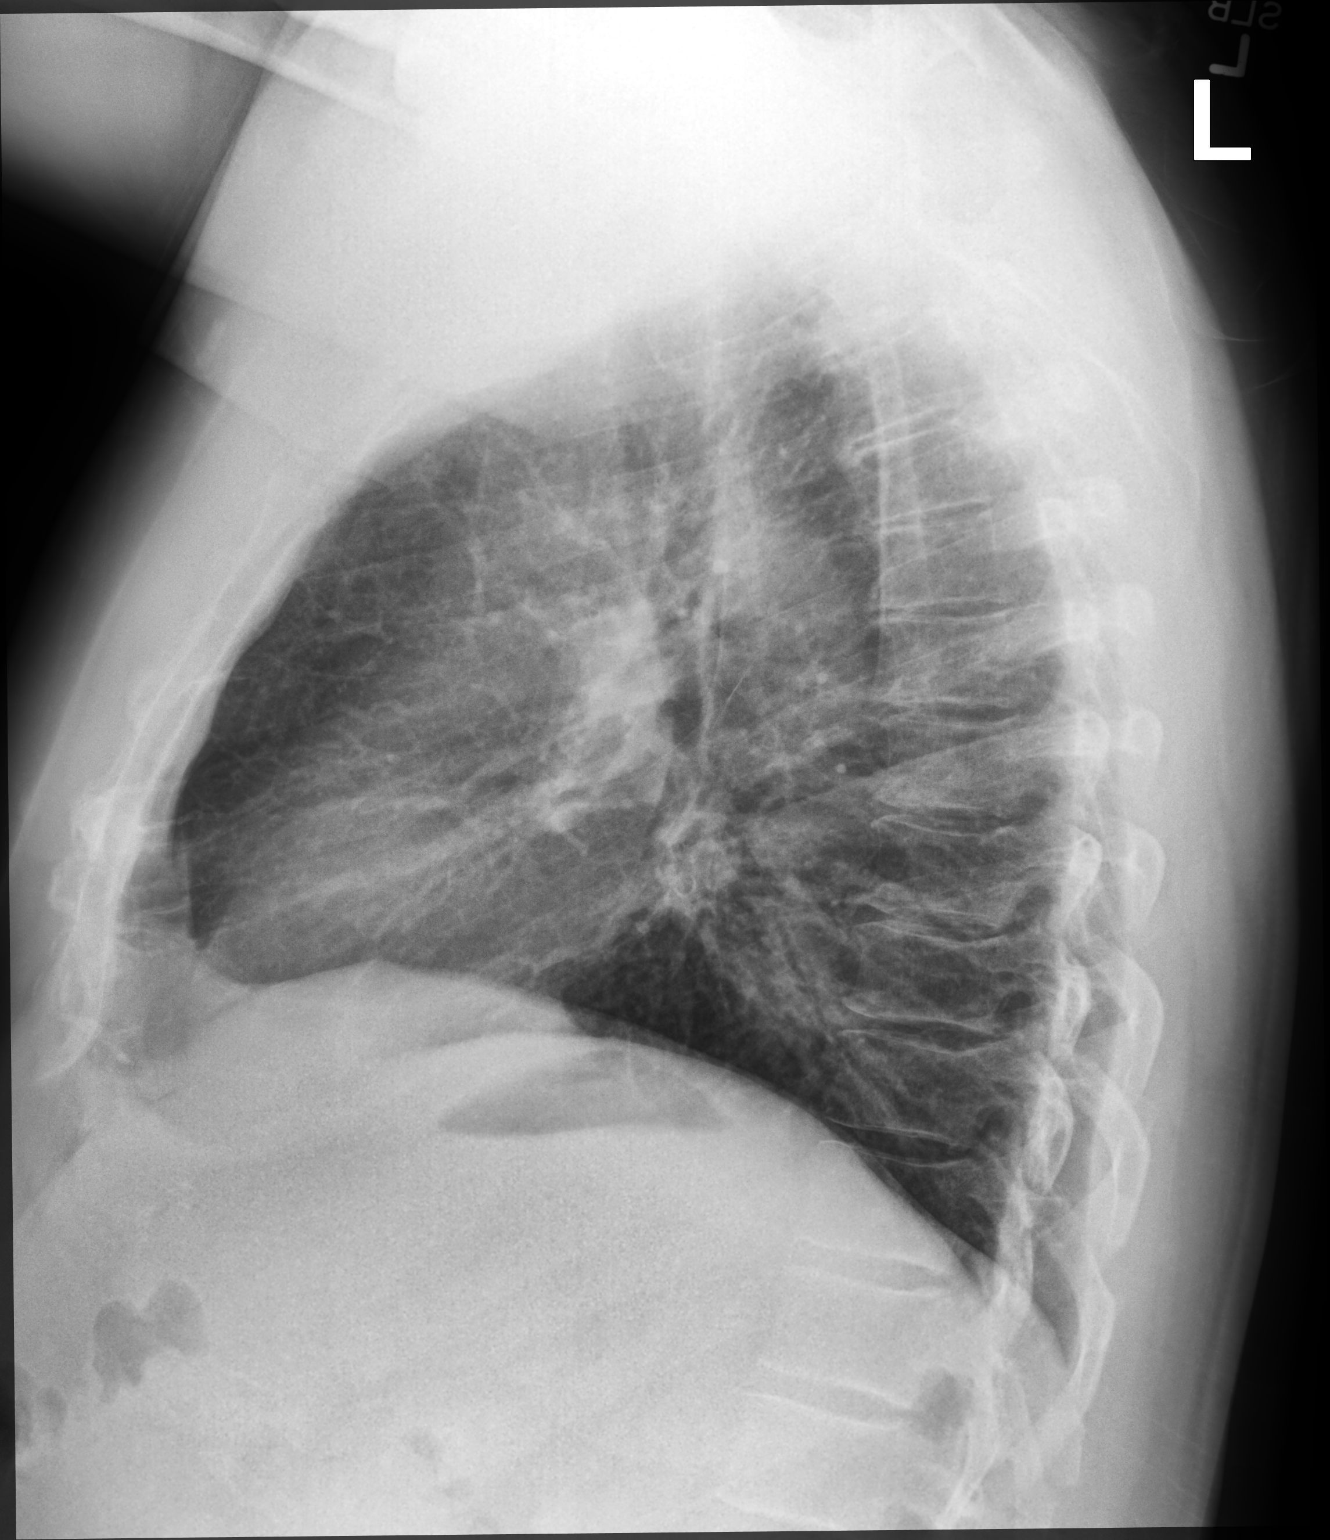

[2 of 2 positions shown; findings below may reference images not displayed]

FINDINGS: Two views of the chest demonstrate slightly coarse lung markings
without focal airspace disease or overt pulmonary edema. No pleural
effusions. Heart and mediastinum are within normal limits. Trachea
is midline. Negative for a pneumothorax. No acute bone abnormality.
IMPRESSION: No acute cardiopulmonary disease.

## 2022-11-25 ENCOUNTER — Ambulatory Visit
Admission: RE | Admit: 2022-11-25 | Discharge: 2022-11-25 | Disposition: A | Discharge status: Home / Self Care | Payer: BC Managed Care – PPO | Source: Ambulatory Visit | Attending: Orthopedic Surgery | Admitting: Orthopedic Surgery

## 2022-11-25 DIAGNOSIS — M25562 Pain in left knee: Secondary | ICD-10-CM

## 2023-03-01 ENCOUNTER — Other Ambulatory Visit: Payer: Self-pay | Admitting: Family Medicine

## 2023-03-01 DIAGNOSIS — I10 Essential (primary) hypertension: Secondary | ICD-10-CM

## 2023-03-02 ENCOUNTER — Telehealth: Payer: BC Managed Care – PPO | Admitting: Physician Assistant

## 2023-03-02 DIAGNOSIS — Z20828 Contact with and (suspected) exposure to other viral communicable diseases: Secondary | ICD-10-CM | POA: Diagnosis not present

## 2023-03-02 DIAGNOSIS — J4521 Mild intermittent asthma with (acute) exacerbation: Secondary | ICD-10-CM

## 2023-03-02 MED ORDER — PREDNISONE 20 MG PO TABS: 40.0000 mg | ORAL_TABLET | Freq: Every day | ORAL | 0 refills | Status: AC

## 2023-03-02 MED ORDER — OSELTAMIVIR PHOSPHATE 75 MG PO CAPS: 75.0000 mg | ORAL_CAPSULE | Freq: Two times a day (BID) | ORAL | 0 refills | Status: AC

## 2023-03-02 MED ORDER — ALBUTEROL SULFATE HFA 108 (90 BASE) MCG/ACT IN AERS: 2.0000 | INHALATION_SPRAY | Freq: Four times a day (QID) | RESPIRATORY_TRACT | 0 refills | Status: DC | PRN

## 2023-03-02 MED ORDER — ALBUTEROL SULFATE (2.5 MG/3ML) 0.083% IN NEBU: 2.5000 mg | INHALATION_SOLUTION | Freq: Four times a day (QID) | RESPIRATORY_TRACT | 1 refills | Status: AC | PRN

## 2023-03-02 NOTE — Progress Notes (Signed)
 Virtual Visit Consent   Kyle Werner, you are scheduled for a virtual visit with a [facility] provider today. Just as with appointments in the office, your consent must be obtained to participate. Your consent will be active for this visit and any virtual visit you may have with one of our providers in the next 365 days. If you have a MyChart account, a copy of this consent can be sent to you electronically.  As this is a virtual visit, video technology does not allow for your provider to perform a traditional examination. This may limit your provider's ability to fully assess your condition. If your provider identifies any concerns that need to be evaluated in person or the need to arrange testing (such as labs, EKG, etc.), we will make arrangements to do so. Although advances in technology are sophisticated, we cannot ensure that it will always work on either your end or our end. If the connection with a video visit is poor, the visit may have to be switched to a telephone visit. With either a video or telephone visit, we are not always able to ensure that we have a secure connection.  By engaging in this virtual visit, you consent to the provision of healthcare and authorize for your insurance to be billed (if applicable) for the services provided during this visit. Depending on your insurance coverage, you may receive a charge related to this service.  I need to obtain your verbal consent now. Are you willing to proceed with your visit today? Benson Porcaro has provided verbal consent on 03/02/2023 for a virtual visit (video or telephone). Harlene PEDLAR Ward, PA-C  Date: 03/02/2023 4:17 PM  Virtual Visit via Video Note   I, Harlene PEDLAR Ward, connected with  Kyle Werner  ([id number], 06-12-1965) on 03/02/23 at  4:15 PM EST by a video-enabled telemedicine application and verified that I am speaking with the correct person using two identifiers.  Location: Patient: Virtual Visit Location Patient:  Home Provider: Virtual Visit Location Provider: Home Office   I discussed the limitations of evaluation and management by telemedicine and the availability of in person appointments. The patient expressed understanding and agreed to proceed.    History of Present Illness: Kyle Werner is a 57 y.o. who identifies as a male who was assigned male at birth, and is being seen today for congestion, productive cough, fatigue that started about 2-3 days ago. Reports wheezing, mostly at night. Has a h/o asthma. He reports he has not had his inhaler, out of it at this time.  He has a nebulizer at home, but is out the liquid. Reports last week his wife and child had the flu. Reports fever and chills, but fever comes down with Tylenol.    HPI: HPI  Problems:  Patient Active Problem List   Diagnosis Date Noted   Mixed hyperlipidemia 04/06/2022   Morbid obesity (HCC) 04/06/2022   Essential hypertension 01/07/2022   Hypogonadism in male 01/07/2022   Encounter for drug screening 01/07/2022    Allergies: No Known Allergies Medications:  Current Outpatient Medications:    albuterol  (PROVENTIL ) (2.5 MG/3ML) 0.083% nebulizer solution, Take 3 mLs (2.5 mg total) by nebulization every 6 (six) hours as needed for wheezing or shortness of breath., Disp: 150 mL, Rfl: 1   albuterol  (VENTOLIN  HFA) 108 (90 Base) MCG/ACT inhaler, Inhale 2 puffs into the lungs every 6 (six) hours as needed for wheezing or shortness of breath., Disp: 8 g, Rfl: 0   Na Sulfate-K Sulfate-Mg  Sulf 17.5-3.13-1.6 GM/177ML SOLN, Use as directed, Disp: 354 mL, Rfl: 0   oseltamivir  (TAMIFLU ) 75 MG capsule, Take 1 capsule (75 mg total) by mouth 2 (two) times daily for 5 days., Disp: 10 capsule, Rfl: 0   predniSONE  (DELTASONE ) 20 MG tablet, Take 2 tablets (40 mg total) by mouth daily with breakfast for 5 days., Disp: 10 tablet, Rfl: 0   losartan -hydrochlorothiazide (HYZAAR) 100-12.5 MG tablet, Take 1 tablet by mouth daily. **NEEDS TO BE SEEN BEFORE  NEXT REFILL**, Disp: 30 tablet, Rfl: 0   testosterone  cypionate (DEPOTESTOTERONE CYPIONATE) 100 MG/ML injection, Inject 2 mLs (200 mg total) into the muscle every 14 (fourteen) days. For IM use only, Disp: 10 mL, Rfl: 0  Observations/Objective: Patient is well-developed, well-nourished in no acute distress.  Resting comfortably  at home.  Head is normocephalic, atraumatic.  No labored breathing.  Speech is clear and coherent with logical content.  Patient is alert and oriented at baseline.    Assessment and Plan: 1. Mild intermittent asthma with exacerbation (Primary)  2. Exposure to the flu  Pt speaking in complete sentences, no labored breathing noted during interview.  Will treat as asthma exacerbation.  Will send in Tamiflu  given positive exposure and h/o asthma. ED/UC precautions given.   Follow Up Instructions: I discussed the assessment and treatment plan with the patient. The patient was provided an opportunity to ask questions and all were answered. The patient agreed with the plan and demonstrated an understanding of the instructions.  A copy of instructions were sent to the patient via MyChart unless otherwise noted below.     The patient was advised to call back or seek an in-person evaluation if the symptoms worsen or if the condition fails to improve as anticipated.    Harlene PEDLAR Ward, PA-C

## 2023-03-02 NOTE — Patient Instructions (Signed)
 [name], thank you for joining Harlene PEDLAR Ward, PA-C for today's virtual visit.  While this provider is not your primary care provider (PCP), if your PCP is located in our provider database this encounter information will be shared with them immediately following your visit.   A [facility] MyChart account gives you access to today's visit and all your visits, tests, and labs performed at [facility]  click here if you don't have a  MyChart account or go to [email or web address]  Consent: (Patient) Kyle Werner provided verbal consent for this virtual visit at the beginning of the encounter.  Current Medications:  Current Outpatient Medications:    albuterol  (PROVENTIL ) (2.5 MG/3ML) 0.083% nebulizer solution, Take 3 mLs (2.5 mg total) by nebulization every 6 (six) hours as needed for wheezing or shortness of breath., Disp: 150 mL, Rfl: 1   albuterol  (VENTOLIN  HFA) 108 (90 Base) MCG/ACT inhaler, Inhale 2 puffs into the lungs every 6 (six) hours as needed for wheezing or shortness of breath., Disp: 8 g, Rfl: 0   Na Sulfate-K Sulfate-Mg Sulf 17.5-3.13-1.6 GM/177ML SOLN, Use as directed, Disp: 354 mL, Rfl: 0   oseltamivir  (TAMIFLU ) 75 MG capsule, Take 1 capsule (75 mg total) by mouth 2 (two) times daily for 5 days., Disp: 10 capsule, Rfl: 0   predniSONE  (DELTASONE ) 20 MG tablet, Take 2 tablets (40 mg total) by mouth daily with breakfast for 5 days., Disp: 10 tablet, Rfl: 0   losartan -hydrochlorothiazide (HYZAAR) 100-12.5 MG tablet, Take 1 tablet by mouth daily. **NEEDS TO BE SEEN BEFORE NEXT REFILL**, Disp: 30 tablet, Rfl: 0   testosterone  cypionate (DEPOTESTOTERONE CYPIONATE) 100 MG/ML injection, Inject 2 mLs (200 mg total) into the muscle every 14 (fourteen) days. For IM use only, Disp: 10 mL, Rfl: 0   Medications ordered in this encounter:  Meds ordered this encounter  Medications   albuterol  (VENTOLIN  HFA) 108 (90 Base) MCG/ACT inhaler    Sig: Inhale 2 puffs into  the lungs every 6 (six) hours as needed for wheezing or shortness of breath.    Dispense:  8 g    Refill:  0    Supervising Provider:   BLAISE ALEENE KIDD [id number]   albuterol  (PROVENTIL ) (2.5 MG/3ML) 0.083% nebulizer solution    Sig: Take 3 mLs (2.5 mg total) by nebulization every 6 (six) hours as needed for wheezing or shortness of breath.    Dispense:  150 mL    Refill:  1    Supervising Provider:   BLAISE ALEENE KIDD [[id number]]   predniSONE  (DELTASONE ) 20 MG tablet    Sig: Take 2 tablets (40 mg total) by mouth daily with breakfast for 5 days.    Dispense:  10 tablet    Refill:  0    Supervising Provider:   LAMPTEY, PHILIP O [[id number]]   oseltamivir  (TAMIFLU ) 75 MG capsule    Sig: Take 1 capsule (75 mg total) by mouth 2 (two) times daily for 5 days.    Dispense:  10 capsule    Refill:  0    Supervising Provider:   BLAISE ALEENE KIDD [[id number]]     *If you need refills on other medications prior to your next appointment, please contact your pharmacy*  Follow-Up: Call back or seek an in-person evaluation if the symptoms worsen or if the condition fails to improve as anticipated.  [facility] [phone]  Other Instructions Start Tamiflu .  Use inhaler or nebulizer as needed.  Recommend Delsym for cough and Mucinex  for congestion.  If no improvement may start the prednisone  course.  If you are having worsening symptoms, trouble breathing go to Urgent Care or Emergency department for evaluation.   If you have been instructed to have an in-person evaluation today at a local Urgent Care facility, please use the link below. It will take you to a list of all of our available [facility], including address, phone number and hours of operation. Please do not delay care.  [facility]  If you or a family member do not have a primary care provider, use the link below to schedule a visit and establish care. When you choose a [facility] primary care  physician or advanced practice provider, you gain a long-term partner in health. Find a Primary Care Provider  Learn more about [facility]'s in-office and virtual care options:  - Get Care Now

## 2023-03-05 ENCOUNTER — Other Ambulatory Visit: Payer: Self-pay | Admitting: Family Medicine

## 2023-03-05 DIAGNOSIS — I10 Essential (primary) hypertension: Secondary | ICD-10-CM

## 2023-03-12 ENCOUNTER — Other Ambulatory Visit: Payer: Self-pay | Admitting: Family Medicine

## 2023-03-12 DIAGNOSIS — I10 Essential (primary) hypertension: Secondary | ICD-10-CM

## 2023-03-12 NOTE — Telephone Encounter (Signed)
 Copied from CRM [id number]. Topic: Clinical - Medication Refill >> Mar 12, 2023  3:22 PM [name] wrote: Most Recent Primary Care Visit:  Provider: [facility]  Department: [facility]  Visit Type: LAB  Date: 04/27/2022  Medication: ***  Has the patient contacted their pharmacy?  (Agent: If no, request that the patient contact the pharmacy for the refill. If patient does not wish to contact the pharmacy document the reason why and proceed with request.) (Agent: If yes, when and what did the pharmacy advise?)  Is this the correct pharmacy for this prescription?  If no, delete pharmacy and type the correct one.  This is the patient's preferred pharmacy:  [id number] [place] [place] - [address] [place] [address] Phone: [phone] Fax: [phone]   Has the prescription been filled recently?   Is the patient out of the medication?   Has the patient been seen for an appointment in the last year OR does the patient have an upcoming appointment?   Can we respond through MyChart?   Agent: Please be advised that Rx refills may take up to 3 business days. We ask that you follow-up with your pharmacy.

## 2023-03-12 NOTE — Telephone Encounter (Signed)
 Copied from CRM [id number]. Topic: Clinical - Medication Refill >> Mar 12, 2023  3:17 PM [name] wrote: Most Recent Primary Care Visit:  Provider: [facility]  Department: [facility]  Visit Type: LAB  Date: 04/27/2022  Medication: losartan -hydrochlorothiazide (HYZAAR) 100-12.5 MG tablet  Has the patient contacted their pharmacy? Yes (Agent: If no, request that the patient contact the pharmacy for the refill. If patient does not wish to contact the pharmacy document the reason why and proceed with request.) (Agent: If yes, when and what did the pharmacy advise?)  Is this the correct pharmacy for this prescription? Yes If no, delete pharmacy and type the correct one.  This is the patient's preferred pharmacy:  [id number] [place], [place] - [address] [place] [address] Phone: [phone] Fax: [phone]   Has the prescription been filled recently? Yes  Is the patient out of the medication? Yes. The patient took their last one today.   Has the patient been seen for an appointment in the last year OR does the patient have an upcoming appointment? Yes  Can we respond through MyChart? Yes  Agent: Please be advised that Rx refills may take up to 3 business days. We ask that you follow-up with your pharmacy.

## 2023-03-12 NOTE — Telephone Encounter (Signed)
 Copied from CRM [id number]. Topic: Clinical - Medication Refill >> Mar 12, 2023  3:24 PM [name] wrote: Most Recent Primary Care Visit:  Provider: [facility]  Department: [facility]  Visit Type: LAB  Date: 04/27/2022  Medication: ***  Has the patient contacted their pharmacy?  (Agent: If no, request that the patient contact the pharmacy for the refill. If patient does not wish to contact the pharmacy document the reason why and proceed with request.) (Agent: If yes, when and what did the pharmacy advise?)  Is this the correct pharmacy for this prescription?  If no, delete pharmacy and type the correct one.  This is the patient's preferred pharmacy:  [id number] [place], [place] - [address] [place] [address] Phone: [phone] Fax: [phone]   Has the prescription been filled recently?   Is the patient out of the medication?   Has the patient been seen for an appointment in the last year OR does the patient have an upcoming appointment?   Can we respond through MyChart?   Agent: Please be advised that Rx refills may take up to 3 business days. We ask that you follow-up with your pharmacy.

## 2023-03-12 NOTE — Telephone Encounter (Signed)
 Refills refused by staff. Pt needs appt before he can receive refills.  Next appt is 03/19/2023.

## 2023-03-17 ENCOUNTER — Telehealth: Payer: Self-pay | Admitting: Family Medicine

## 2023-03-17 NOTE — Telephone Encounter (Signed)
 Copied from CRM [id number]. Topic: Clinical - Prescription Issue >> Mar 17, 2023 11:39 AM Felicitas Horse wrote: Reason for CRM: Wife Dina Francisco called in to request provider to speak with about the refusal of prescription medication. Request callback number [phone].

## 2023-03-17 NOTE — Telephone Encounter (Signed)
 Pt aware he was given a courtesy fill and will get further refills at his appt 03/19/2023 and pt voiced understanding.

## 2023-03-19 ENCOUNTER — Ambulatory Visit: Payer: 59 | Admitting: Family Medicine

## 2023-03-19 ENCOUNTER — Encounter: Payer: Self-pay | Admitting: Family Medicine

## 2023-03-19 VITALS — BP 149/88 | HR 84 | Temp 98.8°F | Ht 69.0 in | Wt 225.0 lb

## 2023-03-19 DIAGNOSIS — E6609 Other obesity due to excess calories: Secondary | ICD-10-CM

## 2023-03-19 DIAGNOSIS — I10 Essential (primary) hypertension: Secondary | ICD-10-CM

## 2023-03-19 DIAGNOSIS — J4520 Mild intermittent asthma, uncomplicated: Secondary | ICD-10-CM | POA: Diagnosis not present

## 2023-03-19 DIAGNOSIS — E782 Mixed hyperlipidemia: Secondary | ICD-10-CM | POA: Diagnosis not present

## 2023-03-19 DIAGNOSIS — E66811 Obesity, class 1: Secondary | ICD-10-CM | POA: Insufficient documentation

## 2023-03-19 DIAGNOSIS — Z79899 Other long term (current) drug therapy: Secondary | ICD-10-CM | POA: Insufficient documentation

## 2023-03-19 DIAGNOSIS — R7989 Other specified abnormal findings of blood chemistry: Secondary | ICD-10-CM | POA: Insufficient documentation

## 2023-03-19 DIAGNOSIS — Z6833 Body mass index (BMI) 33.0-33.9, adult: Secondary | ICD-10-CM

## 2023-03-19 MED ORDER — LOSARTAN POTASSIUM-HCTZ 100-12.5 MG PO TABS: 1.0000 | ORAL_TABLET | Freq: Every day | ORAL | 3 refills | Status: DC

## 2023-03-19 MED ORDER — ALBUTEROL SULFATE HFA 108 (90 BASE) MCG/ACT IN AERS: 2.0000 | INHALATION_SPRAY | Freq: Four times a day (QID) | RESPIRATORY_TRACT | 0 refills | Status: DC | PRN

## 2023-03-19 NOTE — Progress Notes (Signed)
Established Patient Office Visit  Subjective   Patient ID: Kyle Werner, male    DOB: 1965-05-05  Age: 58 y.o. MRN: [id number]  Chief Complaint  Patient presents with   Medical Management of Chronic Issues    HPI  HTN Complaint with meds - No, reports that he has been out since 03/14/23. He had a 30 day fill sent on 10/08/22 and no refills after Current Medications - losartan-HCTZ Checking BP at home ranging: "high", was good when he was on medication Pertinent ROS:  Headache - Yes Chest pain - No Dyspnea - No Palpitations - No LE edema - No  2. Testosterone He has been out of this for about for about 1 month. Last UDS was 01/07/22. Overdue for follow up. Reports decreased stamina. 28 day supply was last picked up in June of 2024.  3. Asthma Occasional symptoms.   Past Medical History:  Diagnosis Date   Hypertension       ROS As per HPI.    Objective:     BP (!) 149/88   Pulse 84   Temp 98.8 F (37.1 C) (Temporal)   Ht 5\' 9"  (1.753 m)   Wt 225 lb (102.1 kg)   SpO2 95%   BMI 33.23 kg/m    Physical Exam Vitals and nursing note reviewed.  Constitutional:      General: He is not in acute distress.    Appearance: He is obese. He is not ill-appearing, toxic-appearing or diaphoretic.  Neck:     Thyroid: No thyroid mass, thyromegaly or thyroid tenderness.  Cardiovascular:     Rate and Rhythm: Normal rate and regular rhythm.     Heart sounds: Normal heart sounds. No murmur heard. Pulmonary:     Effort: Pulmonary effort is normal. No respiratory distress.     Breath sounds: Normal breath sounds. No wheezing, rhonchi or rales.  Musculoskeletal:     Cervical back: Neck supple. No rigidity.  Skin:    General: Skin is warm and dry.  Neurological:     General: No focal deficit present.     Mental Status: He is alert and oriented to person, place, and time.  Psychiatric:        Mood and Affect: Mood normal.        Behavior: Behavior normal.     No results  found for any visits on 03/19/23.    The 10-year ASCVD risk score (Arnett DK, et al., 2019) is: 14.9%    Assessment & Plan:   Kyle Werner was seen today for medical management of chronic issues.  Diagnoses and all orders for this visit:  Primary hypertension BP not at goal. Has been out of medication. Refill provided. Monitor BP at home and notify for elevated readings.  -     losartan-hydrochlorothiazide (HYZAAR) 100-12.5 MG tablet; Take 1 tablet by mouth daily. -     CBC with Differential/Platelet; Future -     CMP14+EGFR; Future -     TSH; Future  Mixed hyperlipidemia Return for fasting labs.  -     Lipid panel; Future  Class 1 obesity due to excess calories with serious comorbidity and body mass index (BMI) of 33.0 to 33.9 in adult Diet, exercise, weight loss.   Mild intermittent asthma without complication Well controlled on current regimen.  -     albuterol (VENTOLIN HFA) 108 (90 Base) MCG/ACT inhaler; Inhale 2 puffs into the lungs every 6 (six) hours as needed for wheezing or shortness of breath.  Low testosterone CSA discussed and signed today. UDS pending. Has been out of testosterone. Discussed will refill if UDS is appropriate. PDMP reviewed, no red flags. Discussed any further refills will only be done with an in office visit.  -     ToxASSURE Select 13 (MW), Urine -     Testosterone,Free and Total; Future  Controlled substance agreement signed -     ToxASSURE Select 13 (MW), Urine   Return in about 6 months (around 09/16/2023) for chronic follow up.  The patient indicates understanding of these issues and agrees with the plan.  Gabriel Earing, FNP

## 2023-03-23 LAB — TOXASSURE SELECT 13 (MW), URINE

## 2023-03-25 ENCOUNTER — Other Ambulatory Visit: Payer: Self-pay | Admitting: Family Medicine

## 2023-03-25 DIAGNOSIS — E291 Testicular hypofunction: Secondary | ICD-10-CM

## 2023-03-25 MED ORDER — TESTOSTERONE CYPIONATE 100 MG/ML IM SOLN: 200.0000 mg | INTRAMUSCULAR | 1 refills | Status: AC

## 2023-03-29 ENCOUNTER — Other Ambulatory Visit: Payer: 59

## 2023-05-26 ENCOUNTER — Other Ambulatory Visit: Payer: Self-pay | Admitting: Family Medicine

## 2023-05-26 DIAGNOSIS — E291 Testicular hypofunction: Secondary | ICD-10-CM

## 2023-05-26 NOTE — Addendum Note (Signed)
 Addended by: Gabriel Earing on: 05/26/2023 12:34 PM   Modules accepted: Orders

## 2023-05-26 NOTE — Addendum Note (Signed)
 Addended by: Julious Payer D on: 05/26/2023 10:35 AM   Modules accepted: Orders

## 2023-05-26 NOTE — Telephone Encounter (Signed)
 NTBS for controlled substance refill. Also need appt prior to 10 am so that testosterone can be checked. He was supposed to do this after his last appt but did not.

## 2023-05-26 NOTE — Telephone Encounter (Signed)
 Per PCP pt NTBS for RF of Testosterone

## 2023-05-26 NOTE — Telephone Encounter (Signed)
 Scheduled for 05/31/2023

## 2023-05-31 ENCOUNTER — Ambulatory Visit: Admitting: Family Medicine

## 2023-06-01 ENCOUNTER — Encounter: Payer: Self-pay | Admitting: Family Medicine

## 2023-07-08 ENCOUNTER — Telehealth: Payer: Self-pay

## 2023-07-08 NOTE — Telephone Encounter (Signed)
 Copied from CRM [id number]. Topic: Clinical - Medical Advice >> Jul 08, 2023 10:39 AM Opal Bill wrote: Reason for CRM: Pt needs to speak with someone clinical about a medical issue concerning a hernia and he feels its been injured again due to physical activity.  Pt also says he needs labs ordered in order for his to get a refill of his testosterone  cypionate (DEPOTESTOTERONE CYPIONATE) 100 MG/ML injection. Please contact patient asap.

## 2023-07-08 NOTE — Telephone Encounter (Signed)
 Apt scheduled for 07/23/2023 next available

## 2023-07-23 ENCOUNTER — Ambulatory Visit (INDEPENDENT_AMBULATORY_CARE_PROVIDER_SITE_OTHER): Admitting: Family Medicine

## 2023-07-23 ENCOUNTER — Encounter: Payer: Self-pay | Admitting: Family Medicine

## 2023-07-23 VITALS — BP 129/77 | HR 82 | Temp 98.2°F | Ht 69.0 in | Wt 245.4 lb

## 2023-07-23 DIAGNOSIS — K4090 Unilateral inguinal hernia, without obstruction or gangrene, not specified as recurrent: Secondary | ICD-10-CM | POA: Diagnosis not present

## 2023-07-23 DIAGNOSIS — E782 Mixed hyperlipidemia: Secondary | ICD-10-CM | POA: Diagnosis not present

## 2023-07-23 DIAGNOSIS — I10 Essential (primary) hypertension: Secondary | ICD-10-CM

## 2023-07-23 DIAGNOSIS — J4520 Mild intermittent asthma, uncomplicated: Secondary | ICD-10-CM

## 2023-07-23 DIAGNOSIS — Z9889 Other specified postprocedural states: Secondary | ICD-10-CM

## 2023-07-23 DIAGNOSIS — E291 Testicular hypofunction: Secondary | ICD-10-CM

## 2023-07-23 DIAGNOSIS — Z8719 Personal history of other diseases of the digestive system: Secondary | ICD-10-CM

## 2023-07-23 MED ORDER — ALBUTEROL SULFATE HFA 108 (90 BASE) MCG/ACT IN AERS: 2.0000 | INHALATION_SPRAY | Freq: Four times a day (QID) | RESPIRATORY_TRACT | 1 refills | Status: AC | PRN

## 2023-07-23 NOTE — Progress Notes (Signed)
 Established Patient Office Visit  Subjective   Patient ID: Kyle Werner, male    DOB: 1965/12/18  Age: 58 y.o. MRN: [id number]  Chief Complaint  Patient presents with   Medical Management of Chronic Issues    HPI Kyle Werner is here for possible hernia of the left groin. He reports feeling a twitch in his lower bag about 1 month ago after lifting a bag of mulch. Shortly after this be began feeling a constant pinch in his left groin. Hx of hernia repair in this same location at least a few years ago in [place]. Repair was done with mesh. He has been avoiding heavy lifting since this pain started.   He also needs a refill on his inhaler. Using only occasionally.   His last testosterone  injection was a few weeks ago. Energy has felt up and down. He has not had a testosterone  level drawn since Feb of 2024.  This was ordered after his last appt but he did not have labs drawn. UDS and CSA are UTD.   Compliant with BP medication. Denies symptoms.     ROS As per HPI.    Objective:     BP 129/77   Pulse 82   Temp 98.2 F (36.8 C) (Temporal)   Ht 5\' 9"  (1.753 m)   Wt 245 lb 6.4 oz (111.3 kg)   SpO2 94%   BMI 36.24 kg/m    Physical Exam Vitals and nursing note reviewed.  Constitutional:      General: He is not in acute distress.    Appearance: He is obese. He is not ill-appearing, toxic-appearing or diaphoretic.  Cardiovascular:     Rate and Rhythm: Normal rate and regular rhythm.     Heart sounds: Normal heart sounds. No murmur heard. Pulmonary:     Effort: Pulmonary effort is normal. No respiratory distress.     Breath sounds: Normal breath sounds. No wheezing, rhonchi or rales.  Abdominal:     Hernia: A hernia is present. Hernia is present in the left inguinal area (soft, reducible).  Musculoskeletal:     Right lower leg: No edema.     Left lower leg: No edema.  Skin:    General: Skin is warm and dry.  Neurological:     General: No focal deficit present.     Mental  Status: He is alert and oriented to person, place, and time.  Psychiatric:        Behavior: Behavior normal.      No results found for any visits on 07/23/23.    The 10-year ASCVD risk score (Arnett DK, et al., 2019) is: 11.7%    Assessment & Plan:   Kyle Werner was seen today for medical management of chronic issues.  Diagnoses and all orders for this visit:  Left inguinal hernia History of hernia repair Soft, reducible left inguinal hernia on exam. Hx of mesh repair years ago. With mild pain. Referral to general surgery discussed and placed. Avoid heavy lifting.  -     Ambulatory referral to General Surgery  Primary hypertension Well controlled on current regimen. Will return for labs.   Mixed hyperlipidemia Will return for fasting labs.   Mild intermittent asthma without complication Well controlled on current regimen.  -     albuterol  (VENTOLIN  HFA) 108 (90 Base) MCG/ACT inhaler; Inhale 2 puffs into the lungs every 6 (six) hours as needed for wheezing or shortness of breath.  Hypogonadism in male Will return for early morning draw of testosterone   level. Has been out for a few weeks. Will send in refill pending lab results. Discussed will need repeat after restarting supplement. CSA and UDS are UTD.    Will determine follow up pending labs.    Kyle Huger, FNP

## 2023-07-28 ENCOUNTER — Other Ambulatory Visit

## 2023-07-28 DIAGNOSIS — E782 Mixed hyperlipidemia: Secondary | ICD-10-CM

## 2023-07-28 DIAGNOSIS — R7989 Other specified abnormal findings of blood chemistry: Secondary | ICD-10-CM

## 2023-07-28 DIAGNOSIS — I10 Essential (primary) hypertension: Secondary | ICD-10-CM

## 2023-07-29 LAB — CBC WITH DIFFERENTIAL/PLATELET
Basophils Absolute: 0.1 10*3/uL (ref 0.0–0.2)
Basos: 1 %
EOS (ABSOLUTE): 0.3 10*3/uL (ref 0.0–0.4)
Eos: 3 %
Hematocrit: 55.5 % — ABNORMAL HIGH (ref 37.5–51.0)
Hemoglobin: 18.5 g/dL — ABNORMAL HIGH (ref 13.0–17.7)
Immature Grans (Abs): 0.3 10*3/uL — ABNORMAL HIGH (ref 0.0–0.1)
Immature Granulocytes: 3 %
Lymphocytes Absolute: 1.9 10*3/uL (ref 0.7–3.1)
Lymphs: 19 %
MCH: 31.2 pg (ref 26.6–33.0)
MCHC: 33.3 g/dL (ref 31.5–35.7)
MCV: 94 fL (ref 79–97)
Monocytes Absolute: 0.7 10*3/uL (ref 0.1–0.9)
Monocytes: 7 %
Neutrophils Absolute: 6.6 10*3/uL (ref 1.4–7.0)
Neutrophils: 67 %
Platelets: 311 10*3/uL (ref 150–450)
RBC: 5.93 x10E6/uL — ABNORMAL HIGH (ref 4.14–5.80)
RDW: 12.2 % (ref 11.6–15.4)
WBC: 9.8 10*3/uL (ref 3.4–10.8)

## 2023-07-29 LAB — TESTOSTERONE,FREE AND TOTAL
Testosterone, Free: 8.5 pg/mL (ref 7.2–24.0)
Testosterone: 436 ng/dL (ref 264–916)

## 2023-07-29 LAB — LIPID PANEL
Chol/HDL Ratio: 5.4 ratio — ABNORMAL HIGH (ref 0.0–5.0)
Cholesterol, Total: 210 mg/dL — ABNORMAL HIGH (ref 100–199)
HDL: 39 mg/dL — ABNORMAL LOW (ref >39)
LDL Chol Calc (NIH): 135 mg/dL — ABNORMAL HIGH (ref 0–99)
Triglycerides: 199 mg/dL — ABNORMAL HIGH (ref 0–149)
VLDL Cholesterol Cal: 36 mg/dL (ref 5–40)

## 2023-07-29 LAB — CMP14+EGFR
ALT: 65 IU/L — ABNORMAL HIGH (ref 0–44)
AST: 33 IU/L (ref 0–40)
Albumin: 4.3 g/dL (ref 3.8–4.9)
Alkaline Phosphatase: 101 IU/L (ref 44–121)
BUN/Creatinine Ratio: 14 (ref 9–20)
BUN: 14 mg/dL (ref 6–24)
Bilirubin Total: 0.6 mg/dL (ref 0.0–1.2)
CO2: 21 mmol/L (ref 20–29)
Calcium: 9.4 mg/dL (ref 8.7–10.2)
Chloride: 100 mmol/L (ref 96–106)
Creatinine, Ser: 1.01 mg/dL (ref 0.76–1.27)
Globulin, Total: 3 g/dL (ref 1.5–4.5)
Glucose: 102 mg/dL — ABNORMAL HIGH (ref 70–99)
Potassium: 4.4 mmol/L (ref 3.5–5.2)
Sodium: 137 mmol/L (ref 134–144)
Total Protein: 7.3 g/dL (ref 6.0–8.5)
eGFR: 86 mL/min/{1.73_m2} (ref >59)

## 2023-07-29 LAB — TSH: TSH: 2.52 u[IU]/mL (ref 0.450–4.500)

## 2023-08-02 ENCOUNTER — Ambulatory Visit: Payer: Self-pay | Admitting: Family Medicine

## 2023-08-02 DIAGNOSIS — R7301 Impaired fasting glucose: Secondary | ICD-10-CM

## 2023-08-02 DIAGNOSIS — R1011 Right upper quadrant pain: Secondary | ICD-10-CM

## 2023-08-02 DIAGNOSIS — R748 Abnormal levels of other serum enzymes: Secondary | ICD-10-CM

## 2023-08-02 DIAGNOSIS — D582 Other hemoglobinopathies: Secondary | ICD-10-CM

## 2023-08-02 MED ORDER — EZETIMIBE 10 MG PO TABS: 10.0000 mg | ORAL_TABLET | Freq: Every day | ORAL | 3 refills | Status: AC

## 2023-08-09 ENCOUNTER — Ambulatory Visit: Payer: Self-pay | Admitting: Family Medicine

## 2023-08-09 ENCOUNTER — Other Ambulatory Visit

## 2023-08-09 DIAGNOSIS — R718 Other abnormality of red blood cells: Secondary | ICD-10-CM

## 2023-08-09 DIAGNOSIS — R748 Abnormal levels of other serum enzymes: Secondary | ICD-10-CM

## 2023-08-09 DIAGNOSIS — R7989 Other specified abnormal findings of blood chemistry: Secondary | ICD-10-CM

## 2023-08-09 DIAGNOSIS — D582 Other hemoglobinopathies: Secondary | ICD-10-CM

## 2023-08-09 DIAGNOSIS — R1011 Right upper quadrant pain: Secondary | ICD-10-CM

## 2023-08-09 DIAGNOSIS — R7301 Impaired fasting glucose: Secondary | ICD-10-CM

## 2023-08-09 LAB — BAYER DCA HB A1C WAIVED: HB A1C (BAYER DCA - WAIVED): 5.4 % (ref 4.8–5.6)

## 2023-08-10 LAB — CBC WITH DIFFERENTIAL/PLATELET
Basophils Absolute: 0.1 10*3/uL (ref 0.0–0.2)
Basos: 1 %
EOS (ABSOLUTE): 0.2 10*3/uL (ref 0.0–0.4)
Eos: 3 %
Hematocrit: 53.6 % — ABNORMAL HIGH (ref 37.5–51.0)
Hemoglobin: 17.7 g/dL (ref 13.0–17.7)
Immature Grans (Abs): 0 10*3/uL (ref 0.0–0.1)
Immature Granulocytes: 0 %
Lymphocytes Absolute: 1.9 10*3/uL (ref 0.7–3.1)
Lymphs: 24 %
MCH: 30.7 pg (ref 26.6–33.0)
MCHC: 33 g/dL (ref 31.5–35.7)
MCV: 93 fL (ref 79–97)
Monocytes Absolute: 0.5 10*3/uL (ref 0.1–0.9)
Monocytes: 7 %
Neutrophils Absolute: 5.2 10*3/uL (ref 1.4–7.0)
Neutrophils: 65 %
Platelets: 305 10*3/uL (ref 150–450)
RBC: 5.76 x10E6/uL (ref 4.14–5.80)
RDW: 12.6 % (ref 11.6–15.4)
WBC: 8 10*3/uL (ref 3.4–10.8)

## 2023-08-10 LAB — HEPB+HEPC+HIV PANEL

## 2023-08-20 ENCOUNTER — Ambulatory Visit (HOSPITAL_COMMUNITY)
Admission: RE | Admit: 2023-08-20 | Discharge: 2023-08-20 | Disposition: A | Discharge status: Home / Self Care | Payer: Self-pay | Source: Ambulatory Visit | Attending: Family Medicine | Admitting: Family Medicine

## 2023-08-20 ENCOUNTER — Encounter (HOSPITAL_COMMUNITY): Payer: Self-pay

## 2023-08-20 ENCOUNTER — Telehealth: Payer: Self-pay | Admitting: Family Medicine

## 2023-08-20 DIAGNOSIS — R1011 Right upper quadrant pain: Secondary | ICD-10-CM

## 2023-08-20 DIAGNOSIS — R748 Abnormal levels of other serum enzymes: Secondary | ICD-10-CM

## 2023-08-20 NOTE — Telephone Encounter (Signed)
 Left message advising I would reach back out Monday on my lunch break to touch base regarding referral and answer any other questions they may have.

## 2023-08-20 NOTE — Telephone Encounter (Signed)
 Kyle Werner came into the office today very frustrated.  He asked to speak with office manager.  I took time to listen to his issues and assured him that I would communicate this to the provider as well as management. Below are the issues: 1- Saw [name] for his Inguinal Hernia.  Referral was placed to a general surgeon.  Patient has heard no response on this.  Needs to have this addressed before he has to report to his [employer] job in fall. 2- Pt was scheduled for an CT U/S today and thought it was to address the hernia.  Tech informed him it was for elevated liver enzymes.  Patient states he was not told this and stated he has always had elevated liver enzymes and that he refused the test as he does not want liver enzymes addressed but wanted the hernia priority. 3-  Patient says he was treated rudely by lab personnel and also by nurse who spoke with him.  Patient is really frustrated and would appreciate someone calling to help him get this resolved please.

## 2023-08-23 ENCOUNTER — Telehealth: Payer: Self-pay

## 2023-08-23 NOTE — Telephone Encounter (Signed)
 Copied from CRM [id number]. Topic: General - Other >> Aug 23, 2023  2:26 PM Carlatta H wrote: Reason for CRM: Patient received a call from Nurse Karla//Please call back

## 2023-08-24 NOTE — Telephone Encounter (Signed)
 Patient is scheduled for 6/26 and is aware of Appt.

## 2023-08-24 NOTE — Telephone Encounter (Signed)
 Returned patients call.  Aware Kyle Werner is off today.  Wanting an update on referral from 5/23 for general surgery. Please call patient with update as to why this has not been scheduled yet.   Also will send chart to [facility] as she called him yesterday and a FYI

## 2023-08-26 ENCOUNTER — Encounter: Payer: Self-pay | Admitting: General Surgery

## 2023-08-26 ENCOUNTER — Ambulatory Visit: Admitting: General Surgery

## 2023-08-26 VITALS — BP 125/83 | HR 65 | Temp 98.1°F | Resp 12 | Ht 69.0 in | Wt 243.0 lb

## 2023-08-26 DIAGNOSIS — R1032 Left lower quadrant pain: Secondary | ICD-10-CM

## 2023-08-27 NOTE — Progress Notes (Signed)
 Kyle Werner; [id number]; 1966/01/18   HPI Patient is a 58 year old white male who was referred to my care by Annabella Search for evaluation and treatment of left groin pain.  Patient is status post a laparoscopic left inguinal herniorrhaphy with mesh many years ago in [place] .  He states that recently he was lifting something heavy and experienced left groin pain.  It has been present for several months.  No mass has been noted.  It is made worse with certain movements.  He was concerned that the hernia may be back. Past Medical History:  Diagnosis Date   Hypertension     Past Surgical History:  Procedure Laterality Date   HERNIA REPAIR      Family History  Problem Relation Age of Onset   Cancer Mother    Hypertension Father     Current Outpatient Medications on File Prior to Visit  Medication Sig Dispense Refill   albuterol  (PROVENTIL ) (2.5 MG/3ML) 0.083% nebulizer solution Take 3 mLs (2.5 mg total) by nebulization every 6 (six) hours as needed for wheezing or shortness of breath. 150 mL 1   albuterol  (VENTOLIN  HFA) 108 (90 Base) MCG/ACT inhaler Inhale 2 puffs into the lungs every 6 (six) hours as needed for wheezing or shortness of breath. 18 g 1   ezetimibe  (ZETIA ) 10 MG tablet Take 1 tablet (10 mg total) by mouth daily. 90 tablet 3   losartan -hydrochlorothiazide (HYZAAR) 100-12.5 MG tablet Take 1 tablet by mouth daily. 90 tablet 3   testosterone  cypionate (DEPOTESTOTERONE CYPIONATE) 100 MG/ML injection Inject 2 mLs (200 mg total) into the muscle every 14 (fourteen) days. For IM use only 10 mL 1   No current facility-administered medications on file prior to visit.    No Known Allergies  Social History   Substance and Sexual Activity  Alcohol Use Not Currently    Social History   Tobacco Use  Smoking Status Never   Passive exposure: Never  Smokeless Tobacco Never    Review of Systems  Constitutional:  Positive for malaise/fatigue.  HENT: Negative.     Eyes: Negative.   Respiratory: Negative.    Cardiovascular: Negative.   Gastrointestinal: Negative.   Genitourinary: Negative.   Musculoskeletal: Negative.   Skin: Negative.   Neurological: Negative.   Endo/Heme/Allergies: Negative.   Psychiatric/Behavioral: Negative.      Objective   Vitals:   08/26/23 1117  BP: 125/83  Pulse: 65  Resp: 12  Temp: 98.1 F (36.7 C)  SpO2: 94%    Physical Exam Vitals reviewed.  Constitutional:      Appearance: Normal appearance. He is not ill-appearing.  HENT:     Head: Normocephalic and atraumatic.   Cardiovascular:     Rate and Rhythm: Normal rate and regular rhythm.     Heart sounds: Normal heart sounds. No murmur heard.    No friction rub. No gallop.  Pulmonary:     Effort: Pulmonary effort is normal. No respiratory distress.     Breath sounds: Normal breath sounds. No stridor. No wheezing, rhonchi or rales.  Abdominal:     General: There is no distension.     Palpations: Abdomen is soft. There is no mass.     Tenderness: There is no abdominal tenderness. There is no guarding or rebound.     Hernia: No hernia is present.     Comments: He does have some point tenderness at the left internal ring, but I could not appreciate a hernia.  He was examined while  supine and standing.  Genitourinary:    Testes: Normal.   Skin:    General: Skin is warm and dry.   Neurological:     Mental Status: He is alert and oriented to person, place, and time.     Assessment  Left groin pain, status post laparoscopic left inguinal herniorrhaphy with mesh in the remote past.  I told him that I do not appreciate a hernia at the present time.  It is doubtful that x-rays would discern a hernia at the present time given the physical findings. Plan  I do not recommend reexploration of the left groin at the present time.  This may be muscular strain in nature.  I explained that reexploration can be difficult and the risks of injury and postoperative  pain in the left groin outweigh the benefits at the present time.  He understands and agrees.  I told him to follow-up with me in the future should his symptoms change.  He should avoid any activity that aggravates the left groin.

## 2023-09-02 NOTE — Telephone Encounter (Signed)
 This has been addressed.

## 2023-09-22 ENCOUNTER — Other Ambulatory Visit

## 2023-09-22 DIAGNOSIS — R718 Other abnormality of red blood cells: Secondary | ICD-10-CM

## 2023-09-22 DIAGNOSIS — R7989 Other specified abnormal findings of blood chemistry: Secondary | ICD-10-CM

## 2023-09-23 ENCOUNTER — Ambulatory Visit: Payer: Self-pay | Admitting: Family Medicine

## 2023-09-23 ENCOUNTER — Other Ambulatory Visit: Payer: Self-pay | Admitting: Family Medicine

## 2023-09-23 DIAGNOSIS — R7989 Other specified abnormal findings of blood chemistry: Secondary | ICD-10-CM

## 2023-09-23 LAB — CBC WITH DIFFERENTIAL/PLATELET
Basophils Absolute: 0 x10E3/uL (ref 0.0–0.2)
Basos: 1 %
EOS (ABSOLUTE): 0.1 x10E3/uL (ref 0.0–0.4)
Eos: 1 %
Hematocrit: 47.4 % (ref 37.5–51.0)
Hemoglobin: 15.8 g/dL (ref 13.0–17.7)
Immature Grans (Abs): 0 x10E3/uL (ref 0.0–0.1)
Immature Granulocytes: 0 %
Lymphocytes Absolute: 1.8 x10E3/uL (ref 0.7–3.1)
Lymphs: 24 %
MCH: 30.7 pg (ref 26.6–33.0)
MCHC: 33.3 g/dL (ref 31.5–35.7)
MCV: 92 fL (ref 79–97)
Monocytes Absolute: 0.5 x10E3/uL (ref 0.1–0.9)
Monocytes: 7 %
Neutrophils Absolute: 4.9 x10E3/uL (ref 1.4–7.0)
Neutrophils: 67 %
Platelets: 344 x10E3/uL (ref 150–450)
RBC: 5.15 x10E6/uL (ref 4.14–5.80)
RDW: 12.2 % (ref 11.6–15.4)
WBC: 7.3 x10E3/uL (ref 3.4–10.8)

## 2023-09-23 LAB — TESTOSTERONE,FREE AND TOTAL
Testosterone, Free: 3.1 pg/mL — AB (ref 7.2–24.0)
Testosterone: 131 ng/dL — AB (ref 264–916)

## 2023-10-13 NOTE — Progress Notes (Signed)
 Chief Complaint: Low testosterone  History of Present Illness:  Kyle Werner is a 58 y.o. man here for evaluation and management of low testosterone levels.  Prior testosterone levels: January 07 2022--124/4.0 (drawn at 9:46 AM) April 27 2022--462/10.1 (drawn at 9:04 AM) Jul 28 2023--436/8.  (Drawn at 9:24 AM) September 06 2023--131/3.1 (drawn at 9:09 AM)  He has been on testosterone injections for over 10 years.  This was started by his PCP back in [facility].  He received it for a while while he was in [place] .  He has been on it while here in [facility], but he is turned over to me for further management.  He states that before he started getting testosterone injections he had low energy levels in the ED.  These improved with injections, once every week or 2.  His last injection was in May.  Since then, he has had low energy levels and worsening AD.  Past Medical History:  Past Medical History:  Diagnosis Date   Hypertension     Past Surgical History:  Past Surgical History:  Procedure Laterality Date   HERNIA REPAIR      Allergies:  No Known Allergies  Family History:  Family History  Problem Relation Age of Onset   Cancer Mother    Hypertension Father     Social History:  Social History   Tobacco Use   Smoking status: Never    Passive exposure: Never   Smokeless tobacco: Never  Vaping Use   Vaping status: Never Used  Substance Use Topics   Alcohol use: Not Currently   Drug use: Never    Review of symptoms:  Constitutional:  Negative for unexplained weight loss, night sweats, fever, chills ENT:  Negative for nose bleeds, sinus pain, painful swallowing CV:  Negative for chest pain, shortness of breath, exercise intolerance, palpitations, loss of consciousness Resp:  Negative for cough, wheezing, shortness of breath GI:  Negative for nausea, vomiting, diarrhea, bloody stools GU:  Positives noted in HPI; otherwise negative for gross  hematuria, dysuria, urinary incontinence Neuro:  Negative for seizures, poor balance, limb weakness, slurred speech Psych:  Negative for lack of energy, depression, anxiety Endocrine:  Negative for polydipsia, polyuria, symptoms of hypoglycemia (dizziness, hunger, sweating) Hematologic:  Negative for anemia, purpura, petechia, prolonged or excessive bleeding, use of anticoagulants  Allergic:  Negative for difficulty breathing or choking as a result of exposure to anything; no shellfish allergy; no allergic response (rash/itch) to materials, foods  Physical exam: There were no vitals taken for this visit. GENERAL APPEARANCE:  Well appearing, well developed, well nourished, NAD HEENT: Atraumatic, Normocephalic. NECK: Normal appearance LUNGS: Normal inspiratory and expiratory excursion HEART: Regular Rate ABDOMEN: No inguinal hernias. GU: Phallus normal, no lesions. Scrotal skin normal. Testicles/epididymal structures normal. Meatus normal. EXTREMITIES: Moves all extremities well.  Without clubbing, cyanosis, or edema. NEUROLOGIC:  Alert and oriented x 3, normal gait, CN II-XII grossly intact.  MENTAL STATUS:  Appropriate. SKIN:  Warm, dry and intact.    Results:  I have reviewed referring/prior physicians notes  I have reviewed urinalysis-clear  I have reviewed testosterone results  No PSA results available  Assessment: 1.  Low testosterone level.  He has been on testosterone repletion long-term, his last check last month revealed a level of 131.  He is symptomatic  2.  ED   Plan: 1.  I told him I would take over his testosterone administration.  I would recommend 100 mg IM  every week.  His wife is adept at injecting  2.  I will send in a prescription for sildenafil  3.  PSA and prolactin levels checked today  4.  I will have him come back in 8 months for recheck

## 2023-10-25 ENCOUNTER — Ambulatory Visit (INDEPENDENT_AMBULATORY_CARE_PROVIDER_SITE_OTHER): Admitting: Urology

## 2023-10-25 ENCOUNTER — Encounter: Payer: Self-pay | Admitting: Urology

## 2023-10-25 VITALS — BP 134/81 | HR 69 | Ht 69.0 in | Wt 215.0 lb

## 2023-10-25 DIAGNOSIS — N529 Male erectile dysfunction, unspecified: Secondary | ICD-10-CM | POA: Diagnosis not present

## 2023-10-25 DIAGNOSIS — E291 Testicular hypofunction: Secondary | ICD-10-CM | POA: Diagnosis not present

## 2023-10-25 LAB — URINALYSIS, ROUTINE W REFLEX MICROSCOPIC
Bilirubin, UA: NEGATIVE
Glucose, UA: NEGATIVE
Ketones, UA: NEGATIVE
Leukocytes,UA: NEGATIVE
Nitrite, UA: NEGATIVE
Protein,UA: NEGATIVE
RBC, UA: NEGATIVE
Specific Gravity, UA: >=1.03 — AB (ref 1.005–1.030)
Urobilinogen, Ur: 0.2 mg/dL (ref 0.2–1.0)
pH, UA: 5.5 (ref 5.0–7.5)

## 2023-10-25 MED ORDER — SILDENAFIL CITRATE 100 MG PO TABS: ORAL_TABLET | ORAL | 99 refills | Status: AC

## 2023-10-25 MED ORDER — TESTOSTERONE CYPIONATE 200 MG/ML IM SOLN
100.0000 mg | INTRAMUSCULAR | 1 refills | Status: AC
Start: 2023-10-25 — End: ?

## 2023-10-26 ENCOUNTER — Ambulatory Visit: Payer: Self-pay

## 2023-10-26 LAB — PSA: Prostate Specific Ag, Serum: 1.1 ng/mL (ref 0.0–4.0)

## 2023-10-26 LAB — PROLACTIN: Prolactin: 6.4 ng/mL (ref 3.6–25.2)

## 2024-04-05 ENCOUNTER — Other Ambulatory Visit: Payer: Self-pay | Admitting: Family Medicine

## 2024-04-05 DIAGNOSIS — I10 Essential (primary) hypertension: Secondary | ICD-10-CM

## 2024-06-26 ENCOUNTER — Ambulatory Visit: Admitting: Urology
